# Patient Record
Sex: Male | Born: 1965
Health system: Southern US, Community
[De-identification: ages and names within clinical notes are randomized; demographics above are authoritative.]

## PROBLEM LIST (undated history)

## (undated) DIAGNOSIS — E669 Obesity, unspecified: Secondary | ICD-10-CM

## (undated) DIAGNOSIS — G4733 Obstructive sleep apnea (adult) (pediatric): Secondary | ICD-10-CM

## (undated) DIAGNOSIS — N200 Calculus of kidney: Secondary | ICD-10-CM

## (undated) DIAGNOSIS — M109 Gout, unspecified: Secondary | ICD-10-CM

## (undated) DIAGNOSIS — Z9989 Dependence on other enabling machines and devices: Secondary | ICD-10-CM

## (undated) HISTORY — DX: Obstructive sleep apnea (adult) (pediatric): G47.33

## (undated) HISTORY — DX: Gout, unspecified: M10.9

## (undated) HISTORY — DX: Morbid (severe) obesity due to excess calories: E66.01

## (undated) HISTORY — PX: SHOULDER SURGERY: SHX246

## (undated) HISTORY — DX: Dependence on other enabling machines and devices: Z99.89

## (undated) HISTORY — PX: LITHOTRIPSY: SUR834

---

## 2004-12-20 ENCOUNTER — Emergency Department (HOSPITAL_COMMUNITY): Admission: EM | Admit: 2004-12-20 | Discharge: 2004-12-20 | Payer: Self-pay | Admitting: Emergency Medicine

## 2013-04-17 ENCOUNTER — Emergency Department (HOSPITAL_BASED_OUTPATIENT_CLINIC_OR_DEPARTMENT_OTHER): Payer: No Typology Code available for payment source

## 2013-04-17 ENCOUNTER — Emergency Department (HOSPITAL_BASED_OUTPATIENT_CLINIC_OR_DEPARTMENT_OTHER)
Admission: EM | Admit: 2013-04-17 | Discharge: 2013-04-17 | Disposition: A | Discharge status: Home / Self Care | Payer: No Typology Code available for payment source | Attending: Emergency Medicine | Admitting: Emergency Medicine

## 2013-04-17 ENCOUNTER — Encounter (HOSPITAL_BASED_OUTPATIENT_CLINIC_OR_DEPARTMENT_OTHER): Payer: Self-pay | Admitting: Emergency Medicine

## 2013-04-17 DIAGNOSIS — Z79899 Other long term (current) drug therapy: Secondary | ICD-10-CM | POA: Insufficient documentation

## 2013-04-17 DIAGNOSIS — N200 Calculus of kidney: Secondary | ICD-10-CM | POA: Insufficient documentation

## 2013-04-17 DIAGNOSIS — R112 Nausea with vomiting, unspecified: Secondary | ICD-10-CM | POA: Insufficient documentation

## 2013-04-17 LAB — CBC WITH DIFFERENTIAL/PLATELET
Basophils Relative: 0 % (ref 0–1)
Eosinophils Absolute: 0.2 10*3/uL (ref 0.0–0.7)
Eosinophils Relative: 3 % (ref 0–5)
HCT: 39.6 % (ref 39.0–52.0)
Lymphocytes Relative: 25 % (ref 12–46)
MCH: 30.9 pg (ref 26.0–34.0)
MCHC: 34.6 g/dL (ref 30.0–36.0)
Monocytes Relative: 8 % (ref 3–12)
Platelets: 212 10*3/uL (ref 150–400)
RBC: 4.43 MIL/uL (ref 4.22–5.81)
RDW: 13.5 % (ref 11.5–15.5)
WBC: 7.7 10*3/uL (ref 4.0–10.5)

## 2013-04-17 LAB — URINALYSIS, ROUTINE W REFLEX MICROSCOPIC
Bilirubin Urine: NEGATIVE
Nitrite: NEGATIVE
Specific Gravity, Urine: 1.015 (ref 1.005–1.030)
Urobilinogen, UA: 1 mg/dL (ref 0.0–1.0)
pH: 6.5 (ref 5.0–8.0)

## 2013-04-17 LAB — BASIC METABOLIC PANEL
BUN: 13 mg/dL (ref 6–23)
Calcium: 9.1 mg/dL (ref 8.4–10.5)
Chloride: 102 mEq/L (ref 96–112)
GFR calc non Af Amer: 88 mL/min — ABNORMAL LOW (ref >90)
Potassium: 4.4 mEq/L (ref 3.5–5.1)

## 2013-04-17 MED ORDER — MORPHINE SULFATE 4 MG/ML IJ SOLN
INTRAMUSCULAR | Status: AC
  Filled 2013-04-17: qty 1

## 2013-04-17 MED ORDER — HYDROCODONE-ACETAMINOPHEN 5-325 MG PO TABS: 1.0000 | ORAL_TABLET | Freq: Four times a day (QID) | ORAL | Status: DC | PRN

## 2013-04-17 MED ORDER — MORPHINE SULFATE 4 MG/ML IJ SOLN: 6.0000 mg | Freq: Once | INTRAMUSCULAR | Status: DC

## 2013-04-17 MED ORDER — ONDANSETRON HCL 4 MG/2ML IJ SOLN
INTRAMUSCULAR | Status: AC
  Administered 2013-04-17: 4 mg via INTRAVENOUS
  Filled 2013-04-17: qty 2

## 2013-04-17 MED ORDER — ONDANSETRON HCL 4 MG/2ML IJ SOLN
4.0000 mg | Freq: Once | INTRAMUSCULAR | Status: AC
  Administered 2013-04-17: 4 mg via INTRAVENOUS

## 2013-04-17 MED ORDER — MORPHINE SULFATE 2 MG/ML IJ SOLN
INTRAMUSCULAR | Status: AC
  Administered 2013-04-17: 2 mg via INTRAVENOUS
  Filled 2013-04-17: qty 1

## 2013-04-17 MED ORDER — MORPHINE SULFATE 4 MG/ML IJ SOLN
4.0000 mg | Freq: Once | INTRAMUSCULAR | Status: AC
  Administered 2013-04-17: 4 mg via INTRAVENOUS

## 2013-04-17 MED ORDER — MORPHINE SULFATE 2 MG/ML IJ SOLN
2.0000 mg | Freq: Once | INTRAMUSCULAR | Status: AC
  Administered 2013-04-17: 2 mg via INTRAVENOUS

## 2013-04-17 NOTE — ED Notes (Signed)
Pt presents with acute onset left sided flank pain, pt with history of kidney stone

## 2013-04-17 NOTE — ED Provider Notes (Signed)
CSN: [id number]     Arrival date & time 04/17/13  0540 History   First MD Initiated Contact with Patient 04/17/13 [phone]     Chief Complaint  Patient presents with  . Flank Pain   (Consider location/radiation/quality/duration/timing/severity/associated sxs/prior Treatment) Patient is a 47 y.o. male presenting with flank pain and back pain. The history is provided by the patient. No language interpreter was used.  Flank Pain Pertinent negatives include no chest pain, no abdominal pain, no headaches and no shortness of breath.  Back Pain Pain location: L back, inferior scapula radiating to L flank. Quality:  Stabbing and shooting Radiates to: L flank. Pain severity:  Severe Onset quality:  Sudden Duration:  5 hours Timing:  Constant Progression:  Unchanged Chronicity:  New Relieved by: moveing. Worsened by:  Nothing tried Ineffective treatments: zantac. Associated symptoms: no abdominal pain, no chest pain, no dysuria, no fever, no headaches, no numbness and no weakness   Associated symptoms comment:  Nausea, vomiting    History reviewed. No pertinent past medical history. History reviewed. No pertinent past surgical history. History reviewed. No pertinent family history. History  Substance Use Topics  . Smoking status: Never Smoker   . Smokeless tobacco: Not on file  . Alcohol Use: No    Review of Systems  Constitutional: Negative for fever, activity change, appetite change and fatigue.  HENT: Negative for congestion, facial swelling, rhinorrhea and trouble swallowing.   Eyes: Negative for photophobia and pain.  Respiratory: Negative for cough, chest tightness and shortness of breath.   Cardiovascular: Negative for chest pain and leg swelling.  Gastrointestinal: Negative for nausea, vomiting, abdominal pain, diarrhea and constipation.  Endocrine: Negative for polydipsia and polyuria.  Genitourinary: Positive for flank pain. Negative for dysuria, urgency, decreased urine  volume and difficulty urinating.  Musculoskeletal: Positive for back pain. Negative for gait problem.  Skin: Negative for color change, rash and wound.  Allergic/Immunologic: Negative for immunocompromised state.  Neurological: Negative for dizziness, facial asymmetry, speech difficulty, weakness, numbness and headaches.  Psychiatric/Behavioral: Negative for confusion, decreased concentration and agitation.    Allergies  Review of patient's allergies indicates no known allergies.  Home Medications   Current Outpatient Rx  Name  Route  Sig  Dispense  Refill  . allopurinol (ZYLOPRIM) 100 MG tablet   Oral   Take 100 mg by mouth daily.         Marland Kitchen HYDROcodone-acetaminophen (NORCO) 5-325 MG per tablet   Oral   Take 1 tablet by mouth every 6 (six) hours as needed.   20 tablet   0    BP 154/85  Pulse 83  Temp(Src) 97.8 F (36.6 C) (Oral)  Resp 20  Ht 6' (1.829 m)  Wt 440 lb (199.583 kg)  BMI 59.66 kg/m2  SpO2 100% Physical Exam  Constitutional: He is oriented to person, place, and time. He appears well-developed and well-nourished. No distress.  HENT:  Head: Normocephalic and atraumatic.  Mouth/Throat: No oropharyngeal exudate.  Eyes: Pupils are equal, round, and reactive to light.  Neck: Normal range of motion. Neck supple.  Cardiovascular: Normal rate, regular rhythm and normal heart sounds.  Exam reveals no gallop and no friction rub.   No murmur heard. Pulmonary/Chest: Effort normal and breath sounds normal. No respiratory distress. He has no wheezes. He has no rales.    Abdominal: Soft. Bowel sounds are normal. He exhibits no distension and no mass. There is no tenderness. There is no rebound and no guarding.  Musculoskeletal: Normal range  of motion. He exhibits no edema and no tenderness.  Neurological: He is alert and oriented to person, place, and time.  Skin: Skin is warm and dry.  Psychiatric: He has a normal mood and affect.    ED Course  Procedures  (including critical care time) Labs Review Labs Reviewed  BASIC METABOLIC PANEL - Abnormal; Notable for the following:    Glucose, Bld 127 (*)    GFR calc non Af Amer 88 (*)    All other components within normal limits  URINALYSIS, ROUTINE W REFLEX MICROSCOPIC  CBC WITH DIFFERENTIAL  TROPONIN I   Imaging Review Ct Abdomen Pelvis Wo Contrast  04/17/2013   CLINICAL DATA:  Left flank pain.  History of nephrolithiasis.  EXAM: CT ABDOMEN AND PELVIS WITHOUT CONTRAST  TECHNIQUE: Multidetector CT imaging of the abdomen and pelvis was performed following the standard protocol without intravenous contrast.  COMPARISON:  11/28/2011  FINDINGS: Linear scarring or subsegmental atelectasis in the posterior left lower lobe. Unremarkable unfused evaluation of visualized portions of liver, nondilated gallbladder, spleen, adrenal glands, pancreas, abdominal aorta, stomach, small bowel, appendix, colon. No significant diverticular disease evident. Unenhanced CT was performed per clinician order. Lack of IV contrast limits sensitivity and specificity, especially for evaluation of abdominal/pelvic solid viscera.  Bilateral nephrolithiasis, largest stone or cluster in the left lower pole 11 mm, on the right in the lower pole 3 mm. No ureteral calculus, hydronephrosis, or ureterectasis. Urinary bladder is incompletely distended.  No ascites. No free air. No adenopathy evident. Degenerative disc and facet disease L5-S1.  IMPRESSION: 1. Bilateral nephrolithiasis without hydronephrosis or ureteral calculus.   Electronically Signed   By: Oley Balm M.D.   On: 04/17/2013 08:00   Dg Chest 2 View  04/17/2013   CLINICAL DATA:  Left-sided back pain, history of hypertension and obesity  EXAM: CHEST  2 VIEW  COMPARISON:  CT abdomen pelvis-11/28/2011  FINDINGS: Examination is degraded due to patient body habitus.  Borderline enlarged cardiac silhouette and mediastinal contours. Minimal bilateral infrahilar and bibasilar  opacities, left greater than right, favored to represent atelectasis. No discrete focal airspace opacities. There is mild eventration of the left hemidiaphragm. No pleural effusion or pneumothorax. No evidence of edema. No pneumothorax. Post ORIF of the presumed left glenoid, incompletely evaluated.  IMPRESSION: Perihilar and bibasilar atelectasis without definite acute cardiopulmonary disease.   Electronically Signed   By: Simonne Come M.D.   On: 04/17/2013 08:09    EKG Interpretation    Date/Time:  Saturday April 17 2013 06:38:18 EST Ventricular Rate:  69 PR Interval:  200 QRS Duration: 96 QT Interval:  396 QTC Calculation: 424 R Axis:   62 Text Interpretation:  Normal sinus rhythm Normal ECG Confirmed by DOCHERTY  MD, MEGAN ([id number]) on 04/17/2013 6:57:22 AM            MDM   1. Nephrolithiasis    Pt is a 47 y.o. male with Pmhx as above who presents with sudden onset L sharp posterior back pain radiating to L flank about 4.5 hours ago with associated nausea, vom. No CP, SOB, fever, urinary symptoms. Pt states pain similar to prior episodes of kidney stones, but pain located higher up on back than prior. Suspect kidney stone, but given pt obese, male, have also ordered CXR, EKG, trop.    8:02 AM EKG with no signs of ischemia. CBC, BMP, trop unremarkable.  CT with BL nephrolithiasis, but no hydronephrosis or ureteral calculus. Pt's pain resolved.  Given reassuring w/u suspect  passed stone.  ACS, PE, dissection unlikely. Will ask to f/u with PCP Monday.  Return precautions given for new or worsening symptoms including chest pain, SOB, leg pain/swelling    Shanna Cisco, MD 04/17/13 [phone]

## 2014-03-19 ENCOUNTER — Encounter (HOSPITAL_BASED_OUTPATIENT_CLINIC_OR_DEPARTMENT_OTHER): Payer: Self-pay | Admitting: Emergency Medicine

## 2014-03-19 ENCOUNTER — Emergency Department (HOSPITAL_BASED_OUTPATIENT_CLINIC_OR_DEPARTMENT_OTHER)
Admission: EM | Admit: 2014-03-19 | Discharge: 2014-03-19 | Disposition: A | Discharge status: Home / Self Care | Payer: No Typology Code available for payment source | Attending: Emergency Medicine | Admitting: Emergency Medicine

## 2014-03-19 ENCOUNTER — Emergency Department (HOSPITAL_BASED_OUTPATIENT_CLINIC_OR_DEPARTMENT_OTHER): Payer: No Typology Code available for payment source

## 2014-03-19 DIAGNOSIS — R10A0 Flank pain, unspecified side: Secondary | ICD-10-CM

## 2014-03-19 DIAGNOSIS — N200 Calculus of kidney: Secondary | ICD-10-CM

## 2014-03-19 DIAGNOSIS — Z79899 Other long term (current) drug therapy: Secondary | ICD-10-CM | POA: Insufficient documentation

## 2014-03-19 DIAGNOSIS — Z791 Long term (current) use of non-steroidal anti-inflammatories (NSAID): Secondary | ICD-10-CM | POA: Insufficient documentation

## 2014-03-19 DIAGNOSIS — E669 Obesity, unspecified: Secondary | ICD-10-CM | POA: Diagnosis not present

## 2014-03-19 DIAGNOSIS — R109 Unspecified abdominal pain: Secondary | ICD-10-CM | POA: Diagnosis present

## 2014-03-19 HISTORY — DX: Calculus of kidney: N20.0

## 2014-03-19 HISTORY — DX: Obesity, unspecified: E66.9

## 2014-03-19 LAB — I-STAT CHEM 8, ED
BUN: 18 mg/dL (ref 6–23)
CHLORIDE: 106 meq/L (ref 96–112)
CREATININE: 1.3 mg/dL (ref 0.50–1.35)
Calcium, Ion: 1.14 mmol/L (ref 1.12–1.23)
Glucose, Bld: 130 mg/dL — ABNORMAL HIGH (ref 70–99)
HEMATOCRIT: 44 % (ref 39.0–52.0)
HEMOGLOBIN: 15 g/dL (ref 13.0–17.0)
POTASSIUM: 3.4 meq/L — AB (ref 3.7–5.3)
SODIUM: 135 meq/L — AB (ref 137–147)
TCO2: 24 mmol/L (ref 0–100)

## 2014-03-19 LAB — CBC WITH DIFFERENTIAL/PLATELET
BASOS ABS: 0 10*3/uL (ref 0.0–0.1)
Basophils Relative: 0 % (ref 0–1)
EOS PCT: 2 % (ref 0–5)
Eosinophils Absolute: 0.2 10*3/uL (ref 0.0–0.7)
HCT: 42.7 % (ref 39.0–52.0)
Hemoglobin: 14.8 g/dL (ref 13.0–17.0)
LYMPHS ABS: 3.3 10*3/uL (ref 0.7–4.0)
Lymphocytes Relative: 32 % (ref 12–46)
MCH: 31.2 pg (ref 26.0–34.0)
MCHC: 34.7 g/dL (ref 30.0–36.0)
MCV: 89.9 fL (ref 78.0–100.0)
MONO ABS: 1 10*3/uL (ref 0.1–1.0)
MONOS PCT: 10 % (ref 3–12)
NEUTROS PCT: 56 % (ref 43–77)
Neutro Abs: 5.9 10*3/uL (ref 1.7–7.7)
Platelets: 236 10*3/uL (ref 150–400)
RBC: 4.75 MIL/uL (ref 4.22–5.81)
RDW: 13.8 % (ref 11.5–15.5)
WBC: 10.4 10*3/uL (ref 4.0–10.5)

## 2014-03-19 LAB — URINALYSIS, ROUTINE W REFLEX MICROSCOPIC
BILIRUBIN URINE: NEGATIVE
Glucose, UA: NEGATIVE mg/dL
KETONES UR: NEGATIVE mg/dL
Leukocytes, UA: NEGATIVE
Nitrite: NEGATIVE
Protein, ur: NEGATIVE mg/dL
Specific Gravity, Urine: 1.023 (ref 1.005–1.030)
UROBILINOGEN UA: 0.2 mg/dL (ref 0.0–1.0)
pH: 5.5 (ref 5.0–8.0)

## 2014-03-19 LAB — URINE MICROSCOPIC-ADD ON

## 2014-03-19 MED ORDER — ONDANSETRON 8 MG PO TBDP: ORAL_TABLET | ORAL | Status: DC

## 2014-03-19 MED ORDER — KETOROLAC TROMETHAMINE 30 MG/ML IJ SOLN
30.0000 mg | Freq: Once | INTRAMUSCULAR | Status: AC
  Administered 2014-03-19: 30 mg via INTRAVENOUS
  Filled 2014-03-19: qty 1

## 2014-03-19 MED ORDER — IBUPROFEN 800 MG PO TABS
800.0000 mg | ORAL_TABLET | Freq: Three times a day (TID) | ORAL | Status: DC
Start: 2014-03-19 — End: 2015-11-27

## 2014-03-19 MED ORDER — FENTANYL CITRATE 0.05 MG/ML IJ SOLN
50.0000 ug | Freq: Once | INTRAMUSCULAR | Status: AC
  Administered 2014-03-19: 50 ug via INTRAVENOUS
  Filled 2014-03-19: qty 2

## 2014-03-19 MED ORDER — TAMSULOSIN HCL 0.4 MG PO CAPS
0.4000 mg | ORAL_CAPSULE | Freq: Once | ORAL | Status: AC
  Administered 2014-03-19: 0.4 mg via ORAL
  Filled 2014-03-19: qty 1

## 2014-03-19 MED ORDER — HYDROMORPHONE HCL 1 MG/ML IJ SOLN
1.0000 mg | Freq: Once | INTRAMUSCULAR | Status: AC
  Administered 2014-03-19: 1 mg via INTRAVENOUS
  Filled 2014-03-19: qty 1

## 2014-03-19 MED ORDER — ONDANSETRON HCL 4 MG/2ML IJ SOLN
4.0000 mg | Freq: Once | INTRAMUSCULAR | Status: AC
  Administered 2014-03-19: 4 mg via INTRAVENOUS
  Filled 2014-03-19: qty 2

## 2014-03-19 MED ORDER — OXYCODONE-ACETAMINOPHEN 5-325 MG PO TABS: 1.0000 | ORAL_TABLET | Freq: Four times a day (QID) | ORAL | Status: DC | PRN

## 2014-03-19 MED ORDER — TAMSULOSIN HCL 0.4 MG PO CAPS: 0.4000 mg | ORAL_CAPSULE | Freq: Every day | ORAL | Status: DC

## 2014-03-19 NOTE — ED Notes (Signed)
Pt reports hx of kidney stones - c/o acute onset of left flank pain approx 2hrs pta - pt also w/ n/v as well x1. Pt denies fever or hematuria. Pt restless, tachypniec and diaphoretic on arrival to room.

## 2014-03-19 NOTE — ED Notes (Signed)
Pt ambulating independently w/ steady gait on d/c in no acute distress, A&Ox4. D/c instructions reviewed w/ pt and family - pt and family deny any further questions or concerns at present. Rx given x4  

## 2014-03-19 NOTE — ED Provider Notes (Signed)
CSN: [id number]     Arrival date & time 03/19/14  0143 History   First MD Initiated Contact with Patient 03/19/14 0220     Chief Complaint  Patient presents with  . Flank Pain  . Nephrolithiasis     (Consider location/radiation/quality/duration/timing/severity/associated sxs/prior Treatment) Patient is a 48 y.o. male presenting with flank pain. The history is provided by the patient.  Flank Pain This is a recurrent problem. The current episode started 3 to 5 hours ago. The problem occurs constantly. The problem has not changed since onset.Pertinent negatives include no chest pain, no abdominal pain, no headaches and no shortness of breath. Nothing aggravates the symptoms. Nothing relieves the symptoms. He has tried nothing for the symptoms. The treatment provided no relief.  H/o of stones  Past Medical History  Diagnosis Date  . Kidney stones   . Obesity    Past Surgical History  Procedure Laterality Date  . Shoulder surgery    . Lithotripsy     History reviewed. No pertinent family history. History  Substance Use Topics  . Smoking status: Never Smoker   . Smokeless tobacco: Not on file  . Alcohol Use: Yes     Comment: socially    Review of Systems  Respiratory: Negative for shortness of breath.   Cardiovascular: Negative for chest pain.  Gastrointestinal: Positive for vomiting. Negative for abdominal pain.  Genitourinary: Positive for flank pain.  Neurological: Negative for headaches.  All other systems reviewed and are negative.     Allergies  Review of patient's allergies indicates no known allergies.  Home Medications   Prior to Admission medications   Medication Sig Start Date End Date Taking? Authorizing Provider  meloxicam (MOBIC) 15 MG tablet Take 15 mg by mouth daily.   Yes Historical Provider, MD  allopurinol (ZYLOPRIM) 100 MG tablet Take 100 mg by mouth daily.    Historical Provider, MD  HYDROcodone-acetaminophen (NORCO) 5-325 MG per tablet Take 1  tablet by mouth every 6 (six) hours as needed. 04/17/13   Toy CookeyMegan Docherty, MD   BP 170/77  Pulse 93  Temp(Src) 97.6 F (36.4 C) (Oral)  Resp 16  Ht 6' (1.829 m)  Wt 438 lb (198.675 kg)  BMI 59.39 kg/m2  SpO2 96% Physical Exam  Constitutional: He is oriented to person, place, and time. He appears well-developed and well-nourished. No distress.  HENT:  Head: Normocephalic and atraumatic.  Mouth/Throat: Oropharynx is clear and moist.  Eyes: Conjunctivae are normal. Pupils are equal, round, and reactive to light.  Neck: Normal range of motion. Neck supple. No tracheal deviation present.  Cardiovascular: Normal rate, regular rhythm and intact distal pulses.   Pulmonary/Chest: Effort normal and breath sounds normal. He has no wheezes. He has no rales.  Abdominal: Soft. Bowel sounds are normal. There is no tenderness. There is no rebound and no guarding.  Musculoskeletal: Normal range of motion.  Lymphadenopathy:    He has no cervical adenopathy.  Neurological: He is alert and oriented to person, place, and time.  Skin: Skin is warm and dry.  Psychiatric: He has a normal mood and affect.    ED Course  Procedures (including critical care time) Labs Review Labs Reviewed  I-STAT CHEM 8, ED - Abnormal; Notable for the following:    Sodium 135 (*)    Potassium 3.4 (*)    Glucose, Bld 130 (*)    All other components within normal limits  CBC WITH DIFFERENTIAL  URINALYSIS, ROUTINE W REFLEX MICROSCOPIC    Imaging  Review Ct Abdomen Pelvis Wo Contrast  03/19/2014   CLINICAL DATA:  Acute onset of left flank pain.  Diaphoresis.  EXAM: CT ABDOMEN AND PELVIS WITHOUT CONTRAST  TECHNIQUE: Multidetector CT imaging of the abdomen and pelvis was performed following the standard protocol without IV contrast.  COMPARISON:  CT scan dated 04/17/2013  FINDINGS: There are two 7 mm stones obstructing the proximal left ureter. There are multiple stones in the lower pole of the left kidney as well as a 5 mm  stone in the left renal pelvis. There is a 5 mm stone in the lower pole of the right kidney.  There are multiple gallstones. The gallbladder is not distended. No dilated bile ducts. Liver parenchyma, spleen, pancreas, and adrenal glands are normal. There are a few scattered diverticula in the colon. Terminal ileum and appendix are normal. Bladder is normal. Prostate gland is not enlarged. Severe degenerative disc disease at L5-S1.  IMPRESSION: 1. Two 7 mm stones obstruct the proximal left ureter. 2. Bilateral renal calculi. 3. Cholelithiasis.   Electronically Signed   By: Geanie CooleyJim  Maxwell M.D.   On: 03/19/2014 02:53     EKG Interpretation None      MDM   Final diagnoses:  Flank pain    Medications  fentaNYL (SUBLIMAZE) injection 50 mcg (50 mcg Intravenous Given 03/19/14 0210)  ondansetron (ZOFRAN) injection 4 mg (4 mg Intravenous Given 03/19/14 0210)  ketorolac (TORADOL) 30 MG/ML injection 30 mg (30 mg Intravenous Given 03/19/14 0244)  ondansetron (ZOFRAN) injection 4 mg (4 mg Intravenous Given 03/19/14 0244)  HYDROmorphone (DILAUDID) injection 1 mg (1 mg Intravenous Given 03/19/14 0303)     Strain all urine follow up with urology in 7 days.  Return for intractable pain vomiting or fevers.    Jasmine AweApril K Nathaneal Sommers-Rasch, MD 03/19/14 [phone]

## 2015-10-31 ENCOUNTER — Ambulatory Visit: Payer: No Typology Code available for payment source | Admitting: Internal Medicine

## 2015-11-27 ENCOUNTER — Emergency Department (HOSPITAL_BASED_OUTPATIENT_CLINIC_OR_DEPARTMENT_OTHER)
Admission: EM | Admit: 2015-11-27 | Discharge: 2015-11-27 | Disposition: A | Discharge status: Home / Self Care | Payer: No Typology Code available for payment source | Attending: Emergency Medicine | Admitting: Emergency Medicine

## 2015-11-27 ENCOUNTER — Encounter (HOSPITAL_BASED_OUTPATIENT_CLINIC_OR_DEPARTMENT_OTHER): Payer: Self-pay | Admitting: Emergency Medicine

## 2015-11-27 ENCOUNTER — Emergency Department (HOSPITAL_BASED_OUTPATIENT_CLINIC_OR_DEPARTMENT_OTHER): Payer: No Typology Code available for payment source

## 2015-11-27 DIAGNOSIS — R109 Unspecified abdominal pain: Secondary | ICD-10-CM | POA: Insufficient documentation

## 2015-11-27 DIAGNOSIS — Z6841 Body Mass Index (BMI) 40.0 and over, adult: Secondary | ICD-10-CM | POA: Insufficient documentation

## 2015-11-27 DIAGNOSIS — E669 Obesity, unspecified: Secondary | ICD-10-CM | POA: Insufficient documentation

## 2015-11-27 LAB — CBG MONITORING, ED: GLUCOSE-CAPILLARY: 144 mg/dL — AB (ref 65–99)

## 2015-11-27 LAB — COMPREHENSIVE METABOLIC PANEL
ALBUMIN: 4.2 g/dL (ref 3.5–5.0)
ALK PHOS: 80 U/L (ref 38–126)
ALT: 37 U/L (ref 17–63)
AST: 29 U/L (ref 15–41)
Anion gap: 12 (ref 5–15)
BILIRUBIN TOTAL: 0.6 mg/dL (ref 0.3–1.2)
BUN: 17 mg/dL (ref 6–20)
CO2: 22 mmol/L (ref 22–32)
CREATININE: 1.2 mg/dL (ref 0.61–1.24)
Calcium: 9.6 mg/dL (ref 8.9–10.3)
Chloride: 104 mmol/L (ref 101–111)
GFR calc non Af Amer: >60 mL/min (ref >60)
Glucose, Bld: 147 mg/dL — ABNORMAL HIGH (ref 65–99)
Potassium: 3.9 mmol/L (ref 3.5–5.1)
Sodium: 138 mmol/L (ref 135–145)
Total Protein: 7.3 g/dL (ref 6.5–8.1)

## 2015-11-27 LAB — CBC WITH DIFFERENTIAL/PLATELET
BASOS PCT: 0 %
Basophils Absolute: 0 10*3/uL (ref 0.0–0.1)
Eosinophils Absolute: 0.2 10*3/uL (ref 0.0–0.7)
Eosinophils Relative: 2 %
HEMATOCRIT: 45.9 % (ref 39.0–52.0)
HEMOGLOBIN: 16.2 g/dL (ref 13.0–17.0)
Lymphocytes Relative: 35 %
Lymphs Abs: 3.7 10*3/uL (ref 0.7–4.0)
MCH: 31 pg (ref 26.0–34.0)
MCHC: 35.3 g/dL (ref 30.0–36.0)
MCV: 87.9 fL (ref 78.0–100.0)
MONOS PCT: 9 %
Monocytes Absolute: 1 10*3/uL (ref 0.1–1.0)
NEUTROS ABS: 5.6 10*3/uL (ref 1.7–7.7)
NEUTROS PCT: 53 %
Platelets: 232 10*3/uL (ref 150–400)
RBC: 5.22 MIL/uL (ref 4.22–5.81)
RDW: 14.9 % (ref 11.5–15.5)
WBC: 10.5 10*3/uL (ref 4.0–10.5)

## 2015-11-27 LAB — URINALYSIS, ROUTINE W REFLEX MICROSCOPIC
Bilirubin Urine: NEGATIVE
Glucose, UA: NEGATIVE mg/dL
Hgb urine dipstick: NEGATIVE
KETONES UR: NEGATIVE mg/dL
LEUKOCYTES UA: NEGATIVE
Nitrite: NEGATIVE
PH: 5.5 (ref 5.0–8.0)
Protein, ur: NEGATIVE mg/dL
Specific Gravity, Urine: 1.022 (ref 1.005–1.030)

## 2015-11-27 LAB — LIPASE, BLOOD: Lipase: 24 U/L (ref 11–51)

## 2015-11-27 MED ORDER — MORPHINE SULFATE (PF) 4 MG/ML IV SOLN
8.0000 mg | Freq: Once | INTRAVENOUS | Status: AC
  Administered 2015-11-27: 8 mg via INTRAVENOUS
  Filled 2015-11-27: qty 2

## 2015-11-27 MED ORDER — SODIUM CHLORIDE 0.9 % IV BOLUS (SEPSIS): 1000.0000 mL | Freq: Once | INTRAVENOUS | Status: DC

## 2015-11-27 MED ORDER — ONDANSETRON HCL 4 MG/2ML IJ SOLN
4.0000 mg | Freq: Once | INTRAMUSCULAR | Status: AC
  Administered 2015-11-27: 4 mg via INTRAVENOUS

## 2015-11-27 MED ORDER — MORPHINE SULFATE (PF) 4 MG/ML IV SOLN
8.0000 mg | Freq: Once | INTRAVENOUS | Status: AC
Start: 2015-11-27 — End: 2015-11-27
  Administered 2015-11-27: 8 mg via INTRAVENOUS

## 2015-11-27 MED ORDER — KETOROLAC TROMETHAMINE 30 MG/ML IJ SOLN
30.0000 mg | Freq: Once | INTRAMUSCULAR | Status: AC
  Administered 2015-11-27: 30 mg via INTRAVENOUS

## 2015-11-27 MED ORDER — ONDANSETRON 4 MG PO TBDP: ORAL_TABLET | ORAL | Status: DC

## 2015-11-27 MED ORDER — MORPHINE SULFATE 15 MG PO TABS: 15.0000 mg | ORAL_TABLET | ORAL | Status: DC | PRN

## 2015-11-27 MED ORDER — ONDANSETRON HCL 4 MG/2ML IJ SOLN
4.0000 mg | Freq: Once | INTRAMUSCULAR | Status: AC
  Administered 2015-11-27: 4 mg via INTRAVENOUS
  Filled 2015-11-27: qty 2

## 2015-11-27 MED ORDER — ONDANSETRON 4 MG PO TBDP
ORAL_TABLET | ORAL | Status: AC
  Administered 2015-11-27: 11:00:00
  Filled 2015-11-27: qty 1

## 2015-11-27 MED FILL — MORPHINE SULFATE IR 15 MG T: 15 | 1 days supply | Qty: 7 | Fill #0

## 2015-11-27 MED FILL — ONDANSETRON ODT 4 MG TABLET: 4 | 30 days supply | Qty: 15 | Fill #0

## 2015-11-27 NOTE — ED Notes (Signed)
Left flank pain and nausea since 2am.  Hx of kidney stones, reports that it feels the same.

## 2015-11-27 NOTE — ED Notes (Signed)
Downtime charting:0755 PT arrived diaphoretic with complaints of left flank pain since 2am today.  History of kidney stones. pts wife at bedside with him.  Pt A/O x 4.  Pt in obvious pain.

## 2015-11-27 NOTE — Discharge Instructions (Signed)
Flank Pain °Flank pain refers to pain that is located on the side of the body between the upper abdomen and the back. The pain may occur over a short period of time (acute) or may be long-term or reoccurring (chronic). It may be mild or severe. Flank pain can be caused by many things. °CAUSES  °Some of the more common causes of flank pain include: °· Muscle strains.   °· Muscle spasms.   °· A disease of your spine (vertebral disk disease).   °· A lung infection (pneumonia).   °· Fluid around your lungs (pulmonary edema).   °· A kidney infection.   °· Kidney stones.   °· A very painful skin rash caused by the chickenpox virus (shingles).   °· Gallbladder disease.   °HOME CARE INSTRUCTIONS  °Home care will depend on the cause of your pain. In general, °· Rest as directed by your caregiver. °· Drink enough fluids to keep your urine clear or pale yellow. °· Only take over-the-counter or prescription medicines as directed by your caregiver. Some medicines may help relieve the pain. °· Tell your caregiver about any changes in your pain. °· Follow up with your caregiver as directed. °SEEK IMMEDIATE MEDICAL CARE IF:  °· Your pain is not controlled with medicine.   °· You have new or worsening symptoms. °· Your pain increases.   °· You have abdominal pain.   °· You have shortness of breath.   °· You have persistent nausea or vomiting.   °· You have swelling in your abdomen.   °· You feel faint or pass out.   °· You have blood in your urine. °· You have a fever or persistent symptoms for more than 2-3 days. °· You have a fever and your symptoms suddenly get worse. °MAKE SURE YOU:  °· Understand these instructions. °· Will watch your condition. °· Will get help right away if you are not doing well or get worse. °  °This information is not intended to replace advice given to you by your health care provider. Make sure you discuss any questions you have with your health care provider. °  °Document Released: 06/27/2005 Document  Revised: 01/29/2012 Document Reviewed: 12/19/2011 °[employer] Interactive Patient Education ©2016 [employer] ° °

## 2015-11-27 NOTE — ED Provider Notes (Signed)
CSN: [id number]     Arrival date & time 11/27/15  0730 History   First MD Initiated Contact with Patient 11/27/15 [phone]     Chief Complaint  Patient presents with  . Nephrolithiasis     (Consider location/radiation/quality/duration/timing/severity/associated sxs/prior Treatment) Patient is a 50 y.o. male presenting with flank pain. The history is provided by the patient and the spouse.  Flank Pain This is a recurrent problem. The current episode started 6 to 12 hours ago. The problem occurs constantly. The problem has not changed since onset.Pertinent negatives include no chest pain, no abdominal pain, no headaches and no shortness of breath. Nothing aggravates the symptoms. Nothing relieves the symptoms. He has tried nothing for the symptoms. The treatment provided no relief.   50 yo M With left flank pain. The started this morning. Pain is sharp severe stabbing. Denies radiation. Pain comes and goes. Has had some diaphoresis with this. Vomiting. He feels that his prior kidney stones however he has some pain at higher than normal. Patient denies any chest pain or shortness of breath. Had a history of a lymphadenopathy thought was secondary to paracytosis. They think he contracted this in [place]. Patient has not had any recent long travel.  Past Medical History  Diagnosis Date  . Kidney stones   . Obesity   . Obesity    Past Surgical History  Procedure Laterality Date  . Shoulder surgery    . Lithotripsy     History reviewed. No pertinent family history. Social History  Substance Use Topics  . Smoking status: Never Smoker   . Smokeless tobacco: None  . Alcohol Use: Yes     Comment: socially    Review of Systems  Constitutional: Positive for diaphoresis. Negative for fever and chills.  HENT: Negative for congestion and facial swelling.   Eyes: Negative for discharge and visual disturbance.  Respiratory: Negative for shortness of breath.   Cardiovascular: Negative for chest pain  and palpitations.  Gastrointestinal: Positive for nausea. Negative for vomiting, abdominal pain and diarrhea.  Genitourinary: Positive for flank pain.  Musculoskeletal: Negative for myalgias and arthralgias.  Skin: Negative for color change and rash.  Neurological: Negative for tremors, syncope and headaches.  Psychiatric/Behavioral: Negative for confusion and dysphoric mood.      Allergies  Review of patient's allergies indicates no known allergies.  Home Medications   Prior to Admission medications   Medication Sig Start Date End Date Taking? Authorizing Provider  morphine (MSIR) 15 MG tablet Take 1 tablet (15 mg total) by mouth every 4 (four) hours as needed for severe pain. 11/27/15   Melene Plan, DO  ondansetron (ZOFRAN ODT) 4 MG disintegrating tablet  ODT q4 hours prn nausea/vomit 11/27/15   Melene Plan, DO   BP 198/87 mmHg  Pulse 74  Temp(Src) 98.3 F (36.8 C) (Oral)  Resp 22  Ht 6' (1.829 m)  Wt 420 lb (190.511 kg)  BMI 56.95 kg/m2  SpO2 99% Physical Exam  Constitutional: He is oriented to person, place, and time. He appears well-developed and well-nourished.  HENT:  Head: Normocephalic and atraumatic.  Eyes: EOM are normal. Pupils are equal, round, and reactive to light.  Neck: Normal range of motion. Neck supple. No JVD present.  Cardiovascular: Normal rate and regular rhythm.  Exam reveals no gallop and no friction rub.   No murmur heard. Pulmonary/Chest: No respiratory distress. He has no wheezes.  Abdominal: He exhibits no distension. There is no rebound and no guarding.  Musculoskeletal: Normal range  of motion. He exhibits tenderness (about the left CVA).  No midline spinal TTP.  No noted weakness to either lower extremity   Neurological: He is alert and oriented to person, place, and time.  Skin: No rash noted. No pallor.  Psychiatric: He has a normal mood and affect. His behavior is normal.  Nursing note and vitals reviewed.   ED Course  Procedures  (including critical care time) Labs Review Labs Reviewed  URINALYSIS, ROUTINE W REFLEX MICROSCOPIC (NOT AT [facility])  CBC WITH DIFFERENTIAL/PLATELET  COMPREHENSIVE METABOLIC PANEL  LIPASE, BLOOD    Imaging Review Dg Chest 2 View  11/27/2015  CLINICAL DATA:  Left lateral abdominal pain and left flank pain. EXAM: CHEST  2 VIEW COMPARISON:  04/17/2013 FINDINGS: The heart size and mediastinal contours are within normal limits. Both lungs are clear. Fixation screw in the left scapula. No acute bone abnormality. IMPRESSION: No active cardiopulmonary disease. Electronically Signed   By: Francene BoyersJames  Maxwell M.D.   On: 11/27/2015 09:23   Ct Renal Stone Study  11/27/2015  CLINICAL DATA:  Left flank pain and nausea since 2 a.m. today. History kidney stones. EXAM: CT ABDOMEN AND PELVIS WITHOUT CONTRAST TECHNIQUE: Multidetector CT imaging of the abdomen and pelvis was performed following the standard protocol without IV contrast. COMPARISON:  CT scan dated 03/19/2014 FINDINGS: Lower chest:  Normal. Hepatobiliary: The liver parenchyma is normal. Multiple gallstones more apparent than on the prior study. No bile duct dilatation. Pancreas: No mass or inflammatory process identified on this un-enhanced exam. Spleen: Within normal limits in size. Adrenals/Urinary Tract: Small stones in the lower pole of each kidney. No ureteral or bladder calculi. Stomach/Bowel: No evidence of obstruction, inflammatory process, or abnormal fluid collections. Vascular/Lymphatic: No pathologically enlarged lymph nodes. No evidence of abdominal aortic aneurysm. Reproductive: No mass or other significant abnormality. Other: None. Musculoskeletal: No acute abnormality. Severe degenerative disc disease and joint disease at L5-S1. IMPRESSION: 1. Solitary small stones in the lower pole of each kidney. No ureteral or bladder calculi. 2. Chronic cholelithiasis. Electronically Signed   By: Francene BoyersJames  Maxwell M.D.   On: 11/27/2015 09:22   I have personally  reviewed and evaluated these images and lab results as part of my medical decision-making.  ED ECG REPORT   Date: 11/27/2015  Rate: 71  Rhythm: normal sinus rhythm  QRS Axis: right  Intervals: PR prolonged  ST/T Wave abnormalities: normal  Conduction Disutrbances:none  Narrative Interpretation:   Old EKG Reviewed: changes noted new 1rst degree av block  I have personally reviewed the EKG tracing and agree with the computerized printout as noted.  MDM   Final diagnoses:  Acute left flank pain    50 yo M with left flank pain. This feels ago kidney stone and however is little bit higher than normal. Patient's pain is better after morphine and toradol.  This patient's pain is atypical of his normal obtain a CT stone study as well as a chest x-ray.  CT stone study is negative. Patient has a normal chest x-ray. The patient's pain is significantly improved. I'm unsure of the etiology of this pain. Feel that PE is unlikely with no recent travel no lower extremity swelling no tachycardia. The patient a wells 0.  The patient follow-up with his family physician. He continues to have worsening pain he likely needs a angiogram of the chest rule out PE.  9:54 AM:  I have discussed the diagnosis/risks/treatment options with the patient and family and believe the pt to be eligible  for discharge home to follow-up with PCP. We also discussed returning to the ED immediately if new or worsening sx occur. We discussed the sx which are most concerning (e.g., sudden worsening pain, fever, inability to tolerate by mouth, sob) that necessitate immediate return. Medications administered to the patient during their visit and any new prescriptions provided to the patient are listed below.  Medications given during this visit Medications  sodium chloride 0.9 % bolus 1,000 mL (not administered)  morphine 4 MG/ML injection 8 mg (8 mg Intravenous Given 11/27/15 0900)  ondansetron (ZOFRAN) injection 4 mg (4 mg  Intravenous Given 11/27/15 0900)  morphine 4 MG/ML injection 8 mg (8 mg Intravenous Given During Downtime 11/27/15 0844)  ondansetron (ZOFRAN) injection 4 mg (4 mg Intravenous Given During Downtime 11/27/15 0844)  ketorolac (TORADOL) 30 MG/ML injection 30 mg (30 mg Intravenous Given During Downtime 11/27/15 0845)    New Prescriptions   MORPHINE (MSIR) 15 MG TABLET    Take 1 tablet (15 mg total) by mouth every 4 (four) hours as needed for severe pain.   ONDANSETRON (ZOFRAN ODT) 4 MG DISINTEGRATING TABLET    4mg  ODT q4 hours prn nausea/vomit    The patient appears reasonably screen and/or stabilized for discharge and I doubt any other medical condition or other [facility] requiring further screening, evaluation, or treatment in the ED at this time prior to discharge.    Melene Plan, DO 11/27/15 [phone]

## 2016-12-30 ENCOUNTER — Encounter: Payer: Self-pay | Admitting: Cardiology

## 2017-01-16 ENCOUNTER — Ambulatory Visit (INDEPENDENT_AMBULATORY_CARE_PROVIDER_SITE_OTHER): Payer: No Typology Code available for payment source | Admitting: Cardiology

## 2017-01-16 ENCOUNTER — Encounter (INDEPENDENT_AMBULATORY_CARE_PROVIDER_SITE_OTHER): Payer: Self-pay

## 2017-01-16 ENCOUNTER — Encounter: Payer: Self-pay | Admitting: Cardiology

## 2017-01-16 VITALS — BP 150/90 | HR 69 | Ht 72.0 in | Wt >= 6400 oz

## 2017-01-16 DIAGNOSIS — G4733 Obstructive sleep apnea (adult) (pediatric): Secondary | ICD-10-CM | POA: Insufficient documentation

## 2017-01-16 DIAGNOSIS — I491 Atrial premature depolarization: Secondary | ICD-10-CM | POA: Diagnosis not present

## 2017-01-16 DIAGNOSIS — Z9989 Dependence on other enabling machines and devices: Secondary | ICD-10-CM | POA: Diagnosis not present

## 2017-01-16 DIAGNOSIS — R079 Chest pain, unspecified: Secondary | ICD-10-CM

## 2017-01-16 DIAGNOSIS — M109 Gout, unspecified: Secondary | ICD-10-CM | POA: Insufficient documentation

## 2017-01-16 HISTORY — DX: Morbid (severe) obesity due to excess calories: E66.01

## 2017-01-16 NOTE — Progress Notes (Addendum)
Cardiology Office Note:    Date:  01/16/2017   ID:  John Osborn, DOB November 04, 1965, MRN [id number]  PCP:  Ladora Daniel, PA-C  Cardiologist:  Armanda Magic, MD   Referring MD: No ref. provider found   Chief Complaint  Patient presents with  . Sleep Apnea    History of Present Illness:    John Osborn is a 51 y.o. male with a hx of OSA on CPAP and obesity who presents today to establish and new sleep medicine doctor. He had his sleep study done in 1998 in [place] but has not had a sleep study since then.  He is currently on CPAP at 15cm H2O he thinks.  He uses a nasal pillow mask which he tolerates and does not use a chin strap.  He does not snore when he uses the CPAP.  He usually goes to bed around 10pm and falls asleep immediately.  He gets up around 4am to urinate and then gets up around 7am.  He does not feel rested in the am.  He occasionally will have dogs in the bed so his sleep sometimes is disrupted.  He does not have much problems during the day once he is up and does not nap during the day.  He tries to exercise 3 times weekly by riding the bike.  He also has been having some vague chest discomfort recently that is both exertional and nonexertional.  Usually he does not notice it when he is exercising on the bike.  He has chronic DOE due to his morbid obesity which he thinks is unchanged.    Past Medical History:  Diagnosis Date  . Gout   . Kidney stones   . Morbid obesity (HCC) 01/16/2017  . Obesity   . OSA on CPAP     Past Surgical History:  Procedure Laterality Date  . LITHOTRIPSY    . SHOULDER SURGERY      Current Medications: Current Meds  Medication Sig  . allopurinol (ZYLOPRIM) 300 MG tablet Take 300 mg by mouth daily.  . Cholecalciferol 10000 units TABS Take 4,000 Units by mouth daily.  . Cyanocobalamin (B-12 PO) Take 1 tablet by mouth daily.  . Emtricitabine-Tenofovir DF (TRUVADA PO) Take by mouth daily.  . fluticasone (FLONASE) 50 MCG/ACT nasal spray Place 2  sprays into both nostrils as directed.   . indapamide (LOZOL) 2.5 MG tablet Take 2.5 mg by mouth daily.  . Misc Natural Products (STRESS RELEAF PO) Take by mouth daily.  . Multiple Vitamin (MULTIVITAMIN) tablet Take 1 tablet by mouth daily.  . Multiple Vitamins-Minerals (ZINC PO) Take 10 mg by mouth daily.  . Naltrexone-Bupropion HCl ER 8-90 MG TB12 Take 2 tablets by mouth 2 (two) times daily.  . sildenafil (VIAGRA) 100 MG tablet Take 50 mg by mouth daily as needed for erectile dysfunction.  Marland Kitchen testosterone cypionate (DEPOTESTOSTERONE CYPIONATE) 200 MG/ML injection Inject into the muscle every 21 ( twenty-one) days.     Allergies:   No known allergies   Social History   Social History  . Marital status: Married    Spouse name: N/A  . Number of children: N/A  . Years of education: N/A   Social History Main Topics  . Smoking status: Never Smoker  . Smokeless tobacco: Never Used  . Alcohol use Yes     Comment: socially  . Drug use: No  . Sexual activity: Not Asked   Other Topics Concern  . None   Social History Narrative  .  None     Family History: The patient's family history includes Aortic stenosis in his father; Breast cancer in his mother; Diabetes in his father.  ROS:   Please see the history of present illness.     All other systems reviewed and are negative.  EKGs/Labs/Other Studies Reviewed:    The following studies were reviewed today: Office notes from PCP  EKG:  EKG is  ordered today.  The ekg ordered today demonstrates NSR with PACs  Recent Labs: No results found for requested labs within last 8760 hours.   Recent Lipid Panel No results found for: CHOL, TRIG, HDL, CHOLHDL, VLDL, LDLCALC, LDLDIRECT  Physical Exam:    VS:  BP (!) 150/90   Pulse 69   Ht 6' (1.829 m)   Wt (!) 440 lb 9.6 oz (199.9 kg)   SpO2 97%   BMI 59.76 kg/m     Wt Readings from Last 3 Encounters:  01/16/17 (!) 440 lb 9.6 oz (199.9 kg)  11/27/15 (!) 420 lb (190.5 kg)    03/19/14 (!) 438 lb (198.7 kg)     GEN:  Well nourished, well developed in no acute distress HEENT: marked tonsillar enlargement NECK: No JVD; No carotid bruits LYMPHATICS: No lymphadenopathy CARDIAC: RRR, no murmurs, rubs, gallops RESPIRATORY:  Clear to auscultation without rales, wheezing or rhonchi  ABDOMEN: Soft, non-tender, non-distended MUSCULOSKELETAL:  No edema; No deformity  SKIN: Warm and dry NEUROLOGIC:  Alert and oriented x 3 PSYCHIATRIC:  Normal affect   ASSESSMENT:    1. OSA on CPAP   2. Chest pain, unspecified type   3. PAC (premature atrial contraction)   4. Morbid obesity (HCC)    PLAN:    In order of problems listed above:  OSA - the patient is tolerating PAP therapy well without any problems. The patient has been using and benefiting from CPAP use and will continue to benefit from therapy. His CPAP works very well for him but he is having problems with feeling fatigued in the am which I suspect is due to the reduced number of hours of sleep he gets.  I encouraged him to go to bed earlier at night.  He has not had a sleep study in 10 years so I will get a split night sleep study to see where his pressure should be as he may not be on the appropriate pressure and if having too many apneas, this could also be causing his sleepiness.  I will get him a new CPAP machine that has a modem in it as well. He does have enlarged tonsils on exam but given his age and morbid obesity I think he would be high risk for tonsillectomy and his CPAP is working well.  Chest pain - this is vague in description but concerning in that he is morbidly obese and has borderline HTN.  I will get a 2D echo to assess LVF and ETT to rule out ischemia.  His baseline EKG is normal.  PAC's - these are benign Morbid obesity - I have encouraged him to step up his exercise program to 5-6 days weekly if his ETT is normal and cut back on carbs and portions.     Medication Adjustments/Labs and Tests  Ordered: Current medicines are reviewed at length with the patient today.  Concerns regarding medicines are outlined above.  Orders Placed This Encounter  Procedures  . EXERCISE TOLERANCE TEST  . EKG 12-Lead  . ECHOCARDIOGRAM COMPLETE  . Split night study  No orders of the defined types were placed in this encounter.   Signed, Armanda Magicraci Marissah Vandemark, MD  01/16/2017 1:09 PM    [facility]

## 2017-01-16 NOTE — Patient Instructions (Signed)
Medication Instructions:  Your provider recommends that you continue on your current medications as directed. Please refer to the Current Medication list given to you today.    Labwork: None  Testing/Procedures: Your provider has requested that you have an [facility] tolerance [facility]. For further information please visit [email or web address]. Please also follow instruction sheet, as given.  Your provider has requested that you have an echocardiogram. Echocardiography is a painless test that uses sound waves to create images of your heart. It provides your doctor with information about the size and shape of your heart and how well your heart's chambers and valves are working. This procedure takes approximately one hour. There are no restrictions for this procedure.    Your physician has recommended that you have a sleep study. This test records several body functions during sleep, including: brain activity, eye movement, oxygen and carbon dioxide blood levels, heart rate and rhythm, breathing rate and rhythm, the flow of air through your mouth and nose, snoring, body muscle movements, and chest and belly movement.  Follow-Up: Your provider wants you to follow-up in: 1 year with Dr. Mayford Knifeurner. You will receive a reminder letter in the mail two months in advance. If you don't receive a letter, please call our office to schedule the follow-up appointment.    Any Other Special Instructions Will Be Listed Below (If Applicable).     If you need a refill on your cardiac medications before your next appointment, please call your pharmacy.

## 2017-01-17 ENCOUNTER — Ambulatory Visit (HOSPITAL_COMMUNITY): Payer: No Typology Code available for payment source | Attending: Cardiovascular Disease

## 2017-01-17 ENCOUNTER — Telehealth: Payer: Self-pay | Admitting: *Deleted

## 2017-01-17 ENCOUNTER — Other Ambulatory Visit: Payer: Self-pay

## 2017-01-17 ENCOUNTER — Encounter: Payer: Self-pay | Admitting: Cardiology

## 2017-01-17 DIAGNOSIS — G4733 Obstructive sleep apnea (adult) (pediatric): Secondary | ICD-10-CM | POA: Diagnosis not present

## 2017-01-17 DIAGNOSIS — I119 Hypertensive heart disease without heart failure: Secondary | ICD-10-CM | POA: Diagnosis not present

## 2017-01-17 DIAGNOSIS — R002 Palpitations: Secondary | ICD-10-CM | POA: Diagnosis not present

## 2017-01-17 DIAGNOSIS — R079 Chest pain, unspecified: Secondary | ICD-10-CM | POA: Insufficient documentation

## 2017-01-17 DIAGNOSIS — Z6841 Body Mass Index (BMI) 40.0 and over, adult: Secondary | ICD-10-CM | POA: Insufficient documentation

## 2017-01-17 MED ORDER — PERFLUTREN LIPID MICROSPHERE
1.0000 mL | INTRAVENOUS | Status: AC | PRN
  Administered 2017-01-17: 2 mL via INTRAVENOUS

## 2017-01-17 NOTE — Telephone Encounter (Signed)
-----   Message from Henrietta DineKathryn A Kemp, RN sent at 01/16/2017  9:52 AM EDT ----- Regarding: PSG PSG ordered for scheduling  ESS= 17

## 2017-01-17 NOTE — Telephone Encounter (Signed)
Informed patient of upcoming sleep study and patient understanding was verbalized. Patient understands his sleep study is scheduled for Monday March 10 2017. Patient understands his sleep study will be done at [facility] sleep lab. Patient understands he will receive a sleep packet in a week or so. Patient understands to call if he does not receive the sleep packet in a timely manner. Patient agrees with treatment and thanked me for call. 

## 2017-01-22 ENCOUNTER — Encounter: Payer: Self-pay | Admitting: Cardiology

## 2017-01-24 ENCOUNTER — Encounter: Payer: Self-pay | Admitting: Cardiology

## 2017-01-28 ENCOUNTER — Telehealth (HOSPITAL_COMMUNITY): Payer: Self-pay | Admitting: *Deleted

## 2017-01-28 ENCOUNTER — Telehealth (HOSPITAL_COMMUNITY): Payer: Self-pay | Admitting: Cardiology

## 2017-01-28 NOTE — Telephone Encounter (Signed)
Close encounter 

## 2017-01-29 NOTE — Telephone Encounter (Signed)
User: Trina AoGRIFFIN, Madigan Rosensteel A Date/time: 01/28/17 2:35 PM  Comment: Called pt and lmsg for him to CB to r/s ETT appt.   Context:  Outcome: Left Message  Phone number: [phone] Phone Type: Home Phone  Comm. type: Telephone Call type: Outgoing  Contact: Adine MaduraWright, Jacobie Relation to patient: Self

## 2017-01-30 ENCOUNTER — Ambulatory Visit (HOSPITAL_COMMUNITY)
Admission: RE | Admit: 2017-01-30 | Payer: No Typology Code available for payment source | Source: Ambulatory Visit | Attending: Cardiology | Admitting: Cardiology

## 2017-02-07 ENCOUNTER — Encounter: Payer: Self-pay | Admitting: Cardiology

## 2017-02-07 ENCOUNTER — Ambulatory Visit (HOSPITAL_COMMUNITY)
Admission: RE | Admit: 2017-02-07 | Discharge: 2017-02-07 | Disposition: A | Discharge status: Home / Self Care | Payer: No Typology Code available for payment source | Source: Ambulatory Visit | Attending: Cardiovascular Disease | Admitting: Cardiovascular Disease

## 2017-02-07 ENCOUNTER — Encounter (HOSPITAL_COMMUNITY): Payer: Self-pay | Admitting: *Deleted

## 2017-02-07 DIAGNOSIS — R079 Chest pain, unspecified: Secondary | ICD-10-CM

## 2017-02-07 NOTE — Progress Notes (Signed)
Test cancelled due to pt body habitus (440 lbs) the belt would not fit around pt.

## 2017-02-12 ENCOUNTER — Encounter: Payer: Self-pay | Admitting: Cardiology

## 2017-02-12 NOTE — Telephone Encounter (Signed)
I spoke with non invasive cardiology at [facility].  Weight limit for ETT is 350 lbs so they are unable to do ETT.

## 2017-02-13 NOTE — Telephone Encounter (Signed)
Please see the following patient email for responses.

## 2017-02-14 ENCOUNTER — Encounter: Payer: Self-pay | Admitting: Cardiology

## 2017-02-26 ENCOUNTER — Telehealth: Payer: Self-pay | Admitting: *Deleted

## 2017-02-26 DIAGNOSIS — R079 Chest pain, unspecified: Secondary | ICD-10-CM

## 2017-02-26 DIAGNOSIS — R0602 Shortness of breath: Secondary | ICD-10-CM

## 2017-02-26 NOTE — Telephone Encounter (Signed)
Lm to call back ./cy 

## 2017-02-26 NOTE — Telephone Encounter (Signed)
Lm to call re recommendations ./cy

## 2017-02-26 NOTE — Telephone Encounter (Signed)
Previous Messages    ----- Message -----  From: Quintella Reichert, MD  Sent: 02/25/2017  3:17 PM  To: [name], RN  Subject: RE: Stress test on bike              Please order the cardiopulmonary stress test - I think this would actually get [place] a lot of useful info   [name]  ----- Message -----  From: [name], RN  Sent: 02/17/2017 10:21 AM  To: Quintella Reichert, MD  Subject: Stress test on bike                TT- please see below. You had asked me to see if the hospital does a stress bike test (more specifically for Sailor Haughn dob 10/16/1965) and this is the closest thing. Let me know if you would like me to order it. It may benefit if you talked to Dr. Gala Romney too.   Let me know! Thanks!   [name]  ----- Message -----  From: Roderic Ovens  Sent: 02/17/2017 10:04 AM  To: [name], RN  Subject: RE: Help please!                 Hi [name],   I can definitely do a bike stress test, however, the big difference is that I extend the stress test analysis further with gas-exchange parameters and use a slightly different protocol to achieve a peak oxygen consumption. I monitor EKG, Oxygen sats, HR, BP, RPE. If you or Dr. Mayford Knife would like to talk further, I am available this afternoon. Also, maybe Dr. Mayford Knife can further discuss with Dr. Gala Romney. Weight isn't as limiting as actual leg length, abdominal girth and range of motion in obese patients. I can usually individualize/stratify and modify given weight and height.    Thanks,   [name]   ----- Message -----  From: [name], RN  Sent: 02/13/2017 12:04 PM  To: Roderic Ovens  Subject: Help please!                   Hello! Dr. Mayford Knife wants a cardiac stress test but on a bike instead of a treadmill. You are the only place I know with a bike!   1) is it possible to do a regular stress test instead of CPX there?  2) what is the weight limit?  3)  if this IS possible, what order do we place?   Thank you so much for your help!

## 2017-02-26 NOTE — Telephone Encounter (Signed)
  FW: Stress test on bike  Received: Today  Message Contents  [name], RN  P Cv Div Ch St Triage      Previous Messages    ----- Message -----  From: Quintella Reichert, MD  Sent: 02/25/2017  3:17 PM  To: [name], RN  Subject: RE: Stress test on bike              Please order the cardiopulmonary stress test - I think this would actually get [place] a lot of useful info   [name]  ----- Message -----  From: [name], RN  Sent: 02/17/2017 10:21 AM  To: Quintella Reichert, MD  Subject: Stress test on bike                TT- please see below. You had asked me to see if the hospital does a stress bike test (more specifically for John Osborn dob 12/25/1965) and this is the closest thing. Let me know if you would like me to order it. It may benefit if you talked to Dr. Gala Romney too.   Let me know! Thanks!   [name]  ----- Message -----  From: Roderic Ovens  Sent: 02/17/2017 10:04 AM  To: [name], RN  Subject: RE: Help please!                 Hi [name],   I can definitely do a bike stress test, however, the big difference is that I extend the stress test analysis further with gas-exchange parameters and use a slightly different protocol to achieve a peak oxygen consumption. I monitor EKG, Oxygen sats, HR, BP, RPE. If you or Dr. Mayford Knife would like to talk further, I am available this afternoon. Also, maybe Dr. Mayford Knife can further discuss with Dr. Gala Romney. Weight isn't as limiting as actual leg length, abdominal girth and range of motion in obese patients. I can usually individualize/stratify and modify given weight and height.    Thanks,   [name]   ----- Message -----  From: [name], RN  Sent: 02/13/2017 12:04 PM  To: Roderic Ovens  Subject: Help please!                   Hello! Dr. Mayford Knife wants a cardiac stress test but on a bike instead of a treadmill. You are the only place I know  with a bike!   1) is it possible to do a regular stress test instead of CPX there?  2) what is the weight limit?  3) if this IS possible, what order do we place?   Thank you so much for your help!   [name]

## 2017-02-27 NOTE — Telephone Encounter (Signed)
Reviewed orders of CPX from Dr Mayford Knife with patient.  He is aware the order will be placed and a scheduler will c/b to assist scheduling the appointment.  Advised it is done at [facility] and it takes approximately 2 hours to be completed.  He states understanding.

## 2017-02-27 NOTE — Telephone Encounter (Signed)
Follow up ° ° °Pt is returning call to nurse.  °

## 2017-02-27 NOTE — Telephone Encounter (Signed)
Left message to call back  

## 2017-03-10 ENCOUNTER — Ambulatory Visit (HOSPITAL_BASED_OUTPATIENT_CLINIC_OR_DEPARTMENT_OTHER): Payer: No Typology Code available for payment source | Attending: Cardiology | Admitting: Cardiology

## 2017-03-10 VITALS — Ht 71.0 in | Wt >= 6400 oz

## 2017-03-10 DIAGNOSIS — Z9989 Dependence on other enabling machines and devices: Secondary | ICD-10-CM

## 2017-03-10 DIAGNOSIS — G4733 Obstructive sleep apnea (adult) (pediatric): Secondary | ICD-10-CM | POA: Insufficient documentation

## 2017-03-11 ENCOUNTER — Encounter: Payer: Self-pay | Admitting: Cardiology

## 2017-03-11 NOTE — Procedures (Signed)
Patient awoke at 1:49 unable to return back asleep and requested to discontinued sleep study and to go home. Patient had complained of over-all discomfort especially with his back stating he sleeps on a bariatric mattress at home. Also patient had complaints of not having his cpap devise applied while having his sleep study. Tech explained to patient what type of study he was ordered for at upon arrival at sleep center ( split night study) and again when patient requested to discontinued sleep testing at 1:49 am. Thus, at this point patient was given the leaving the hospital against medical advice form. Patient refused to sign the form and left the sleep center once he was unhooked at 2:17 am. And left the sleep center at 2:20 am Note Light's on was at 2:17 am     Thanks  Ulyess MortVicki Evaline Waltman, RPSGT, RRT, RST

## 2017-03-12 ENCOUNTER — Telehealth: Payer: Self-pay | Admitting: *Deleted

## 2017-03-12 NOTE — Telephone Encounter (Signed)
-----   Message from Quintella Reichertraci R Turner, MD sent at 03/12/2017  4:21 PM EDT ----- Regarding: RE: LEAVING AMA Please call patient and find out why he left sleep study.  Please let him know that the first 2 hours have to be without CPAP to document his OSA and then CPAP is applied  Traci ----- Message ----- From: Terrial RhodesSpruill, Vicki M, RPSGT Sent: 03/11/2017   2:37 AM To: Quintella Reichertraci R Turner, MD Subject: LEAVING AMA                                    Patient left AMA due to over-all discomfort and not having his cpap applied during the night.

## 2017-03-12 NOTE — Telephone Encounter (Signed)
Patient states he didn't feel safe without his CPAP he wears it every night for 20 years now. Patient states he needed a bariatric bed so the bed he had was not comfortable to sleep in. You asked the technician for his CPAP after 3 hours and she declined to put him on it.

## 2017-03-20 NOTE — Procedures (Signed)
   Patient Name: John Osborn, John Osborn Study Date: 03/10/2017 Gender: Male D.O.B: 04-23-1966 Age (years): [age] Referring Provider: Armanda Magicraci Turner MD, ABSM Height (inches): 71 Interpreting Physician: Armanda Magicraci Turner MD, ABSM Weight (lbs): 430 RPSGT: Ulyess MortSpruill, Vicki BMI: 60 MRN: [id number] Neck Size: 21.50  CLINICAL INFORMATION Sleep Study Type: NPSG  Indication for sleep study: Excessive Daytime Sleepiness, Fatigue, Hypertension, Obesity, OSA, Snoring  Epworth Sleepiness Score: 12  SLEEP STUDY TECHNIQUE As per the AASM Manual for the Scoring of Sleep and Associated Events v2.3 (April 2016) with a hypopnea requiring 4% desaturations.  The channels recorded and monitored were frontal, central and occipital EEG, electrooculogram (EOG), submentalis EMG (chin), nasal and oral airflow, thoracic and abdominal wall motion, anterior tibialis EMG, snore microphone, electrocardiogram, and pulse oximetry.  MEDICATIONS Medications self-administered by patient taken the night of the study : N/A  SLEEP ARCHITECTURE The study was initiated at 10:45:22 PM and ended at 2:17:56 AM.  Sleep onset time was 16.4 minutes and the sleep efficiency was 55.7%. The total sleep time was 118.5 minutes.  Stage REM latency was N/A minutes.  The patient spent 21.94% of the night in stage N1 sleep, 78.06% in stage N2 sleep, 0.00% in stage N3 and 0.00% in REM.  Alpha intrusion was absent.  Supine sleep was 100.00%.  RESPIRATORY PARAMETERS The overall apnea/hypopnea index (AHI) was 100.3 per hour. There were 169 total apneas, including 169 obstructive, 0 central and 0 mixed apneas. There were 29 hypopneas and 11 RERAs.  The AHI during Stage REM sleep was N/A per hour.  AHI while supine was 100.3 per hour.  The mean oxygen saturation was 95.14%. The minimum SpO2 during sleep was 86.00%.  moderate snoring was noted during this study.  CARDIAC DATA The 2 lead EKG demonstrated sinus rhythm. The mean heart rate was  71.70 beats per minute. Other EKG findings include: PVCs.  LEG MOVEMENT DATA The total PLMS were 0 with a resulting PLMS index of 0.00. Associated arousal with leg movement index was 0.0 .  IMPRESSIONS - Severe obstructive sleep apnea occurred during this study (AHI = 100.3/h). - No significant central sleep apnea occurred during this study (CAI = 0.0/h). - Moderate oxygen desaturation was noted during this study (Min O2 = 86.00%). - The patient snored with moderate snoring volume. - EKG findings include PVCs. - Clinically significant periodic limb movements did not occur during sleep. No significant associated arousals.  DIAGNOSIS - Obstructive Sleep Apnea (327.23 [G47.33 ICD-10])  RECOMMENDATIONS - Therapeutic CPAP titration to determine optimal pressure required to alleviate sleep disordered breathing. - Positional therapy avoiding supine position during sleep. - Avoid alcohol, sedatives and other CNS depressants that may worsen sleep apnea and disrupt normal sleep architecture. - Sleep hygiene should be reviewed to assess factors that may improve sleep quality. - Weight management and regular exercise should be initiated or continued if appropriate.  Armanda Magicraci Turner Diplomate, American Board of Sleep Medicine  ELECTRONICALLY SIGNED ON:  03/20/2017, 9:43 PM  [facility] PH: [phone]   FX: [phone] ACCREDITED BY THE AMERICAN ACADEMY OF SLEEP MEDICINE

## 2017-03-26 ENCOUNTER — Telehealth: Payer: Self-pay | Admitting: Cardiology

## 2017-03-26 NOTE — Telephone Encounter (Signed)
New message    Pt sent an appt request to see Dr. Mayford Knifeurner. He said he wants to follow up on some outstanding test that he is to do, one is his sleep study. He did not want to wait until January to see Dr. Mayford Knifeurner. He asked for a call from RN.

## 2017-03-27 ENCOUNTER — Telehealth: Payer: Self-pay | Admitting: *Deleted

## 2017-03-27 NOTE — Telephone Encounter (Signed)
Informed patient of sleep study results and patient understanding was verbalized. Patient understands he has severe sleep apnea and Dr Mayford Knifeurner recommends a CPAP titration to treat his OSA.  Patient wants an at home 02 test that he wears at night in his own bariatric bed. Patient would be willing to do the titration test but he needs the pressure above 9cm because he is titrated on 15 cm. Sleep center refused to go above 9 cm for him so he went home. Patient states he delt like he had nothing with the pressure on 9 cm.

## 2017-03-27 NOTE — Telephone Encounter (Signed)
    Informed patient of sleep study results and patient understanding was verbalized. Patient understands he has severe sleep apnea and Dr Mayford Knifeurner recommends a CPAP titration to treat his OSA.  Patient wants an at home 02 test that he wears at night in his own bariatric bed. Patient would be willing to do the titration test but he needs the pressure above 9cm because he is titrated on 15 cm. Sleep center refused to go above 9 cm for him so he went home. Patient states he delt like he had nothing with the pressure on 9 cm.

## 2017-03-27 NOTE — Telephone Encounter (Signed)
Informed patient that he has an order for CPX test and a message was sent to our schedulers to contact him to schedule that appt, and he will receive a call from [place] regarding sleep study results. Patient verbalized understanding and thanked me for he call.

## 2017-03-27 NOTE — Telephone Encounter (Signed)
-----   Message from Quintella Reichertraci R Turner, MD sent at 03/20/2017  9:46 PM EDT ----- Please let patient know that they have severe sleep apnea and recommend CPAP titration. Please set up titration in the sleep lab.

## 2017-03-27 NOTE — Telephone Encounter (Signed)
LMTCB

## 2017-03-28 ENCOUNTER — Encounter: Payer: Self-pay | Admitting: *Deleted

## 2017-03-28 NOTE — Telephone Encounter (Addendum)
RE: LEAVING AMA  Turner, Cornelious Bryantraci R, MD  Reesa ChewJones, Chevy Sweigert G, CMA        Please find out if insurance will pay for just a CPAP titration   Traci   Previous Messages    ----- Message -----  From: Reesa ChewJones, Noelie Renfrow G, CMA  Sent: 03/12/2017  5:06 PM  To: Quintella Reichertraci R Turner, MD  Subject: RE: LEAVING AMA                  Patient states he didn't feel safe without his CPAP he wears it every night for 20 years now.  Patient states he needed a bariatric bed so the bed he had was not comfortable to sleep in.  You asked the technician for his CPAP after 3 hours and she declined to put him on it.  Thanks  ----- Message -----  From: Quintella Reicherturner, Traci R, MD  Sent: 03/12/2017  4:21 PM  To: Reesa Cheworothea G Brazil Voytko, CMA  Subject: RE: LEAVING AMA                  Please call patient and find out why he left sleep study. Please let him know that the first 2 hours have to be without CPAP to document his OSA and then CPAP is applied   Traci  ----- Message -----  From: Terrial RhodesSpruill, Vicki M, RPSGT  Sent: 03/11/2017  2:37 AM  To: Quintella Reichertraci R Turner, MD  Subject: LEAVING AMA                    Patient left AMA due to over-all discomfort and not having his cpap applied during the night.

## 2017-03-30 NOTE — Telephone Encounter (Signed)
John Osborn, can we get a download on his current device to see what his AHI is at 15cm H2O

## 2017-03-31 NOTE — Telephone Encounter (Signed)
[facility] to inquire if [facility] could get a download for our office as the patient does not have a card in his unit. Jasmine DecemberSharon asked for all information and she will contact the patient and obtain the download for our office and fax it to us. Patient notified to expect a call from [facility]. Patient agrees with treatment and thanked me for the call.

## 2017-04-07 ENCOUNTER — Encounter: Payer: Self-pay | Admitting: Cardiology

## 2017-04-07 DIAGNOSIS — Z9989 Dependence on other enabling machines and devices: Principal | ICD-10-CM

## 2017-04-07 DIAGNOSIS — G4733 Obstructive sleep apnea (adult) (pediatric): Secondary | ICD-10-CM

## 2017-04-08 ENCOUNTER — Telehealth: Payer: Self-pay | Admitting: Cardiology

## 2017-04-08 NOTE — Telephone Encounter (Signed)
Called patient to explain that I have sent Dr Mayford Knifeurner a message concerning his question about a home titration since he can not tolerate an in lab test or the beds they have in the lab that are too small for him. lmtcb

## 2017-04-08 NOTE — Telephone Encounter (Signed)
Correction: Dr Mayford Knifeurner patient sates he will go back and do the CPAP Titration you ordered for him.

## 2017-04-08 NOTE — Telephone Encounter (Signed)
New message  Pt call requesting to speak with RN about his cpap machine. Please call back to discuss

## 2017-04-15 NOTE — Telephone Encounter (Signed)
[facility] Jasmine December(Sharon) called to ask if we could put the patient on auto CPAP at 5 to 20 for 2 weeks because the patient really does not want to go back to the lab again then she will send our office a download with his pressure setting and process his request for a new CPAP.

## 2017-04-15 NOTE — Telephone Encounter (Signed)
That is fine 

## 2017-04-17 ENCOUNTER — Ambulatory Visit (HOSPITAL_COMMUNITY): Payer: No Typology Code available for payment source | Attending: Cardiology

## 2017-04-17 DIAGNOSIS — E669 Obesity, unspecified: Secondary | ICD-10-CM | POA: Diagnosis not present

## 2017-04-17 DIAGNOSIS — R0602 Shortness of breath: Secondary | ICD-10-CM | POA: Diagnosis not present

## 2017-04-17 DIAGNOSIS — Z6841 Body Mass Index (BMI) 40.0 and over, adult: Secondary | ICD-10-CM | POA: Insufficient documentation

## 2017-04-17 DIAGNOSIS — R079 Chest pain, unspecified: Secondary | ICD-10-CM | POA: Diagnosis not present

## 2017-04-23 ENCOUNTER — Encounter: Payer: Self-pay | Admitting: Cardiology

## 2017-05-30 IMAGING — CT CT RENAL STONE PROTOCOL
2 of 4 series · 17 of 46 positions shown, 19 images · non-contrast
Comparison: CT scan dated 03/19/2014

CLINICAL DATA: Left flank pain and nausea since 2 a.m. today.
History kidney stones.

EXAM:
CT ABDOMEN AND PELVIS WITHOUT CONTRAST
TECHNIQUE: Multidetector CT imaging of the abdomen and pelvis was performed
following the standard protocol without IV contrast.

[Series 2: axial st · axial · 0.98mm/px · z∈[-522,-67]mm · 14 of 101 slices shown, 16 images]
[im 5/101  soft-tissue]
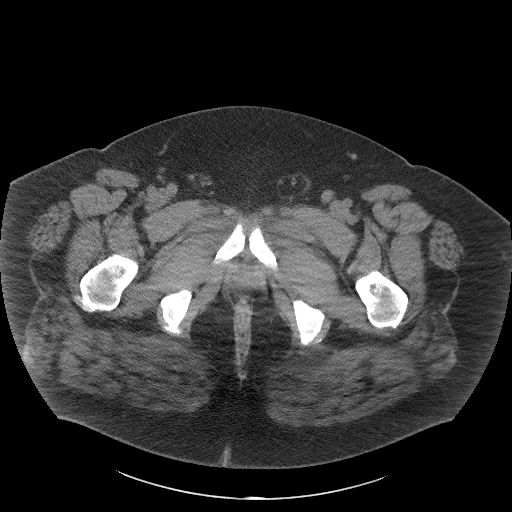
[im 5/101  bone]
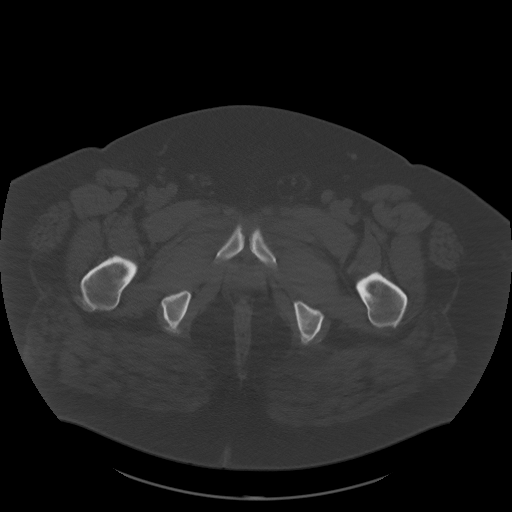
[im 14/101  soft-tissue]
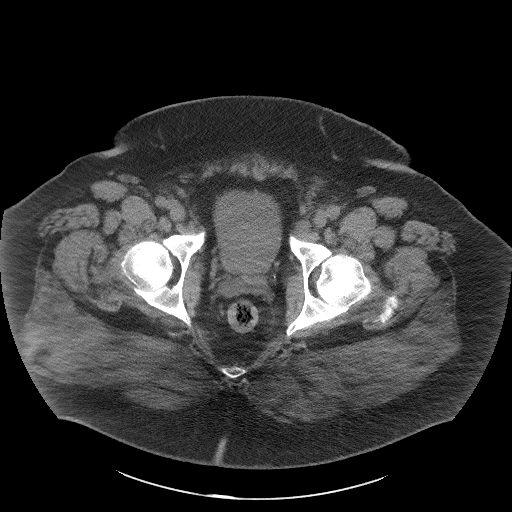
[im 19/101  soft-tissue]
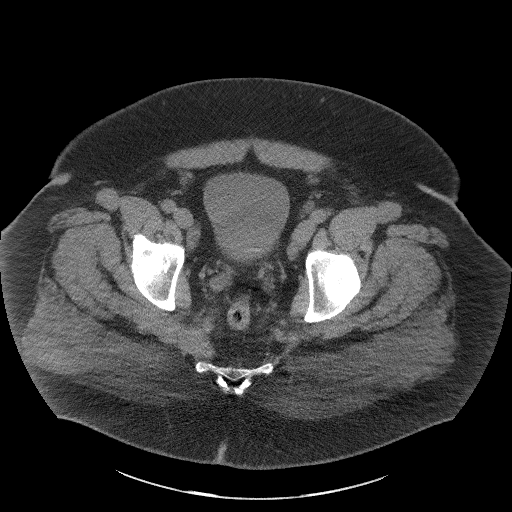
[im 28/101  soft-tissue]
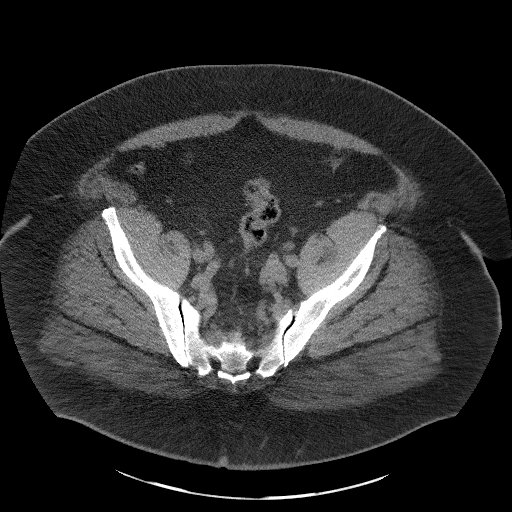
[im 32/101  soft-tissue]
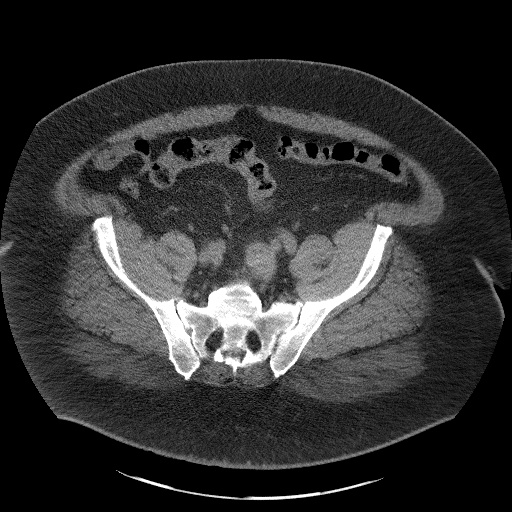
[im 41/101  soft-tissue]
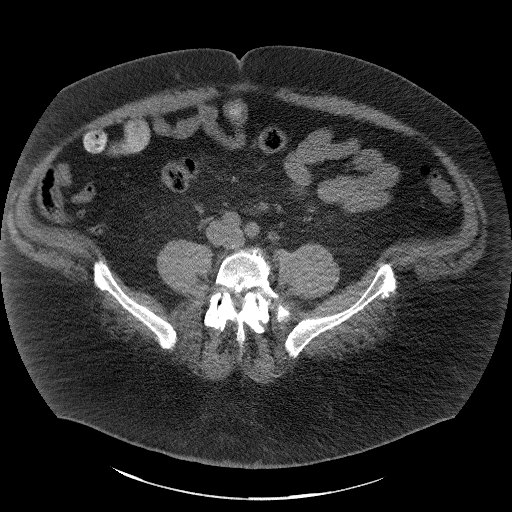
[im 46/101  soft-tissue]
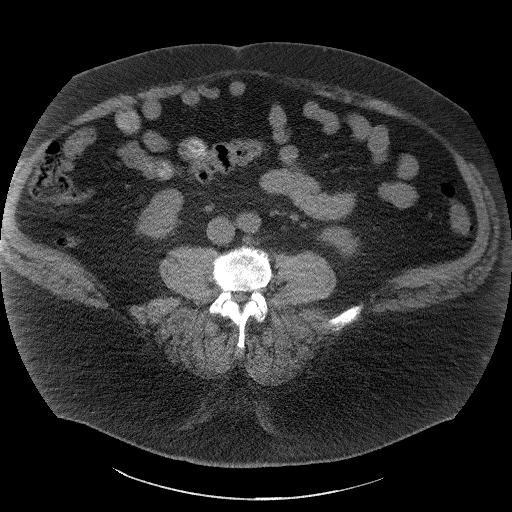
[im 55/101  soft-tissue]
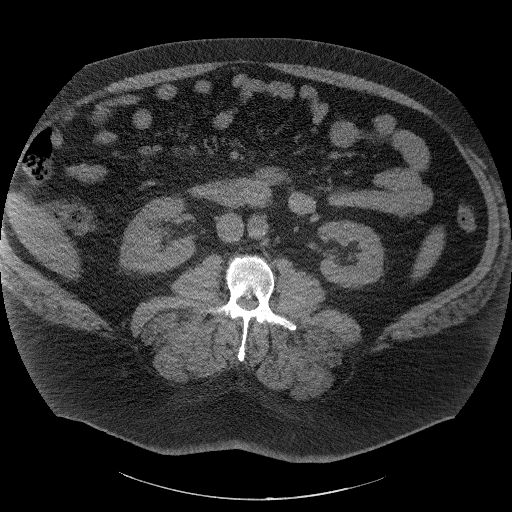
[im 60/101  soft-tissue]
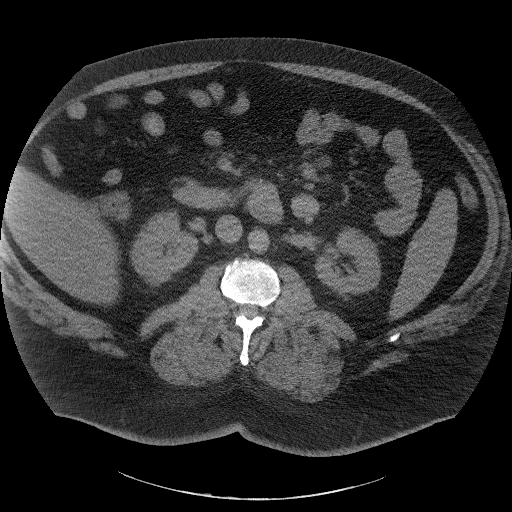
[im 60/101  bone]
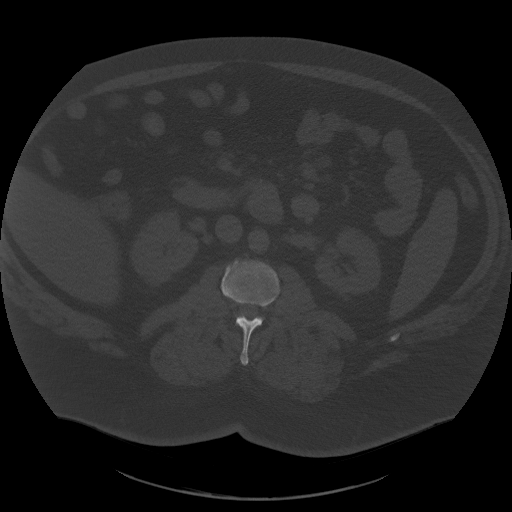
[im 69/101  soft-tissue]
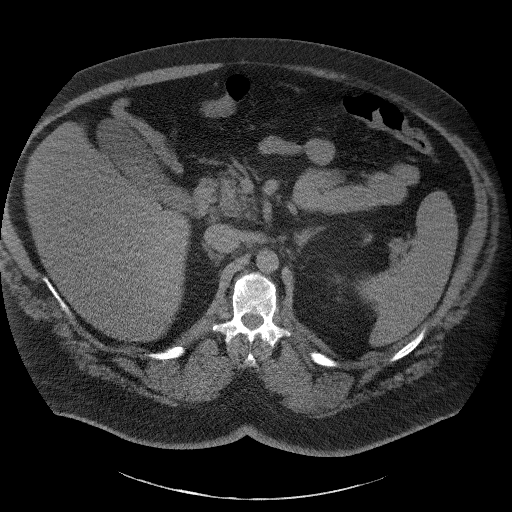
[im 73/101  soft-tissue]
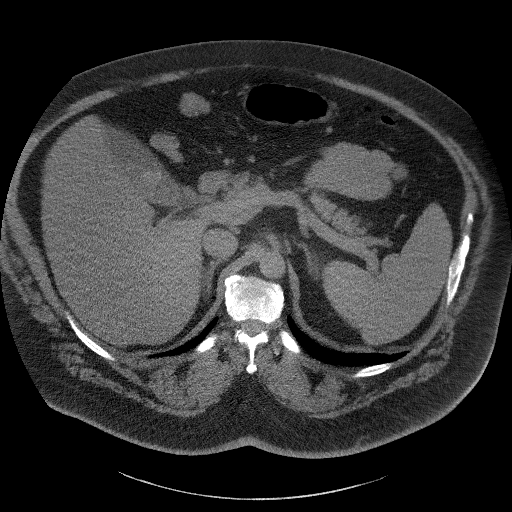
[im 82/101  soft-tissue]
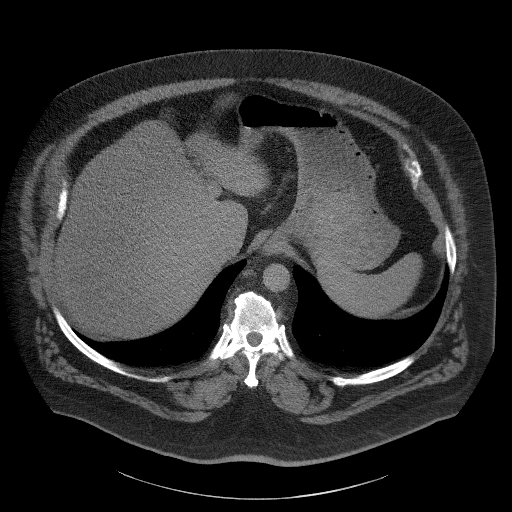
[im 87/101  soft-tissue]
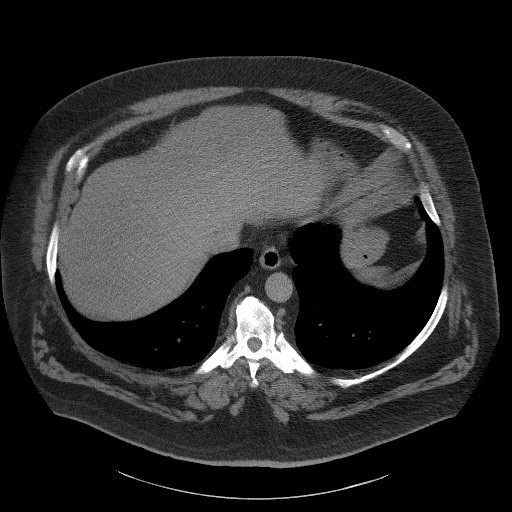
[im 96/101  soft-tissue]
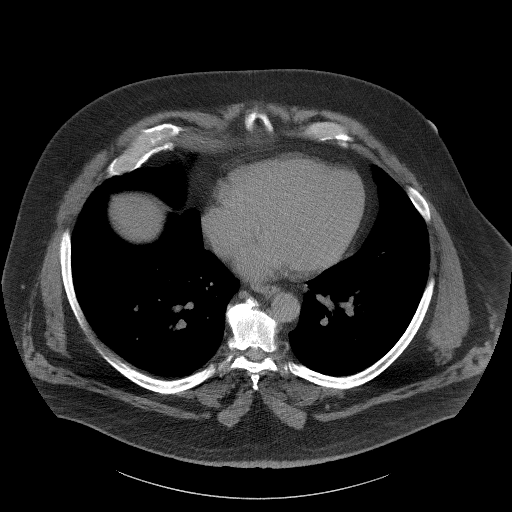

[Series 5: coronal st · coronal · 1.00mm/px · 3 of 163 slices shown]
[im 55/163  soft-tissue]
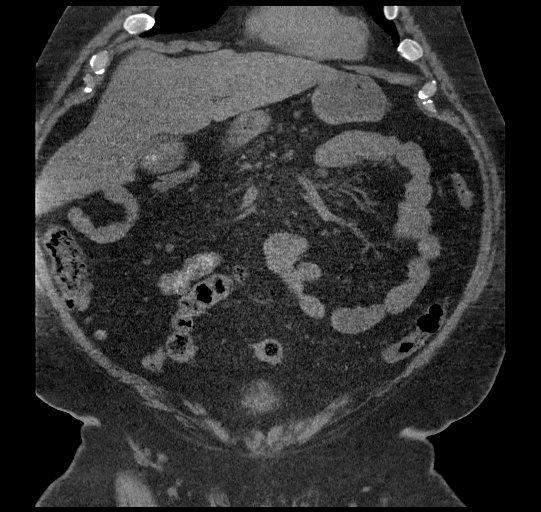
[im 73/163  soft-tissue]
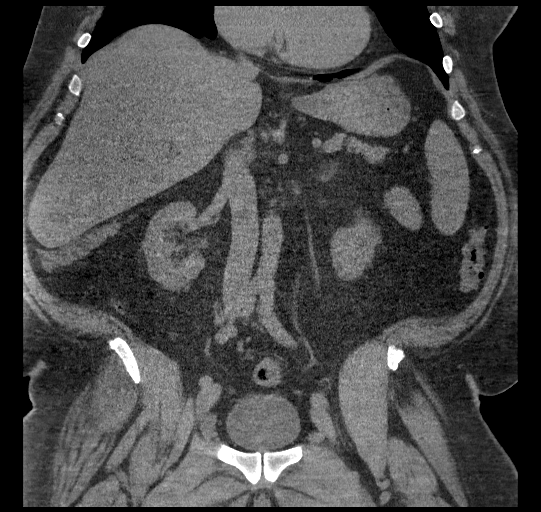
[im 91/163  soft-tissue]
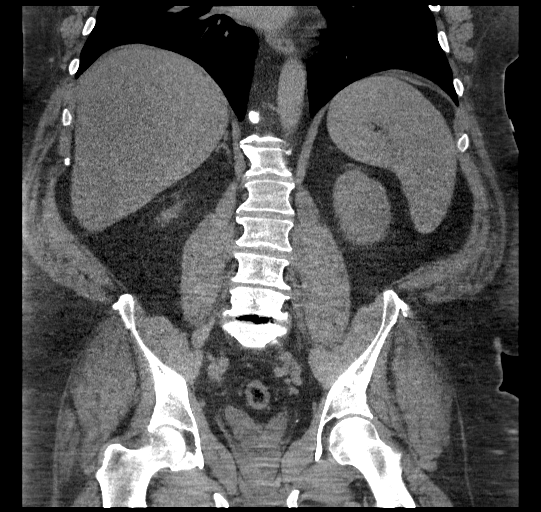

[17 of 46 positions shown; findings below may reference images not displayed]

FINDINGS: Lower chest:  Normal.

Hepatobiliary: The liver parenchyma is normal. Multiple gallstones
more apparent than on the prior study. No bile duct dilatation.

Pancreas: No mass or inflammatory process identified on this
un-enhanced exam.

Spleen: Within normal limits in size.

Adrenals/Urinary Tract: Small stones in the lower pole of each
kidney. No ureteral or bladder calculi.

Stomach/Bowel: No evidence of obstruction, inflammatory process, or
abnormal fluid collections.

Vascular/Lymphatic: No pathologically enlarged lymph nodes. No
evidence of abdominal aortic aneurysm.

Reproductive: No mass or other significant abnormality.

Other: None.

Musculoskeletal: No acute abnormality. Severe degenerative disc
disease and joint disease at L5-S1.
IMPRESSION: 1. Solitary small stones in the lower pole of each kidney. No
ureteral or bladder calculi.
2. Chronic cholelithiasis.

## 2017-05-30 IMAGING — DX DG CHEST 2V
2 series · 2 of 2 positions shown · non-contrast
Comparison: 04/17/2013

CLINICAL DATA: Left lateral abdominal pain and left flank pain.

EXAM:
CHEST  2 VIEW

[chest pa]
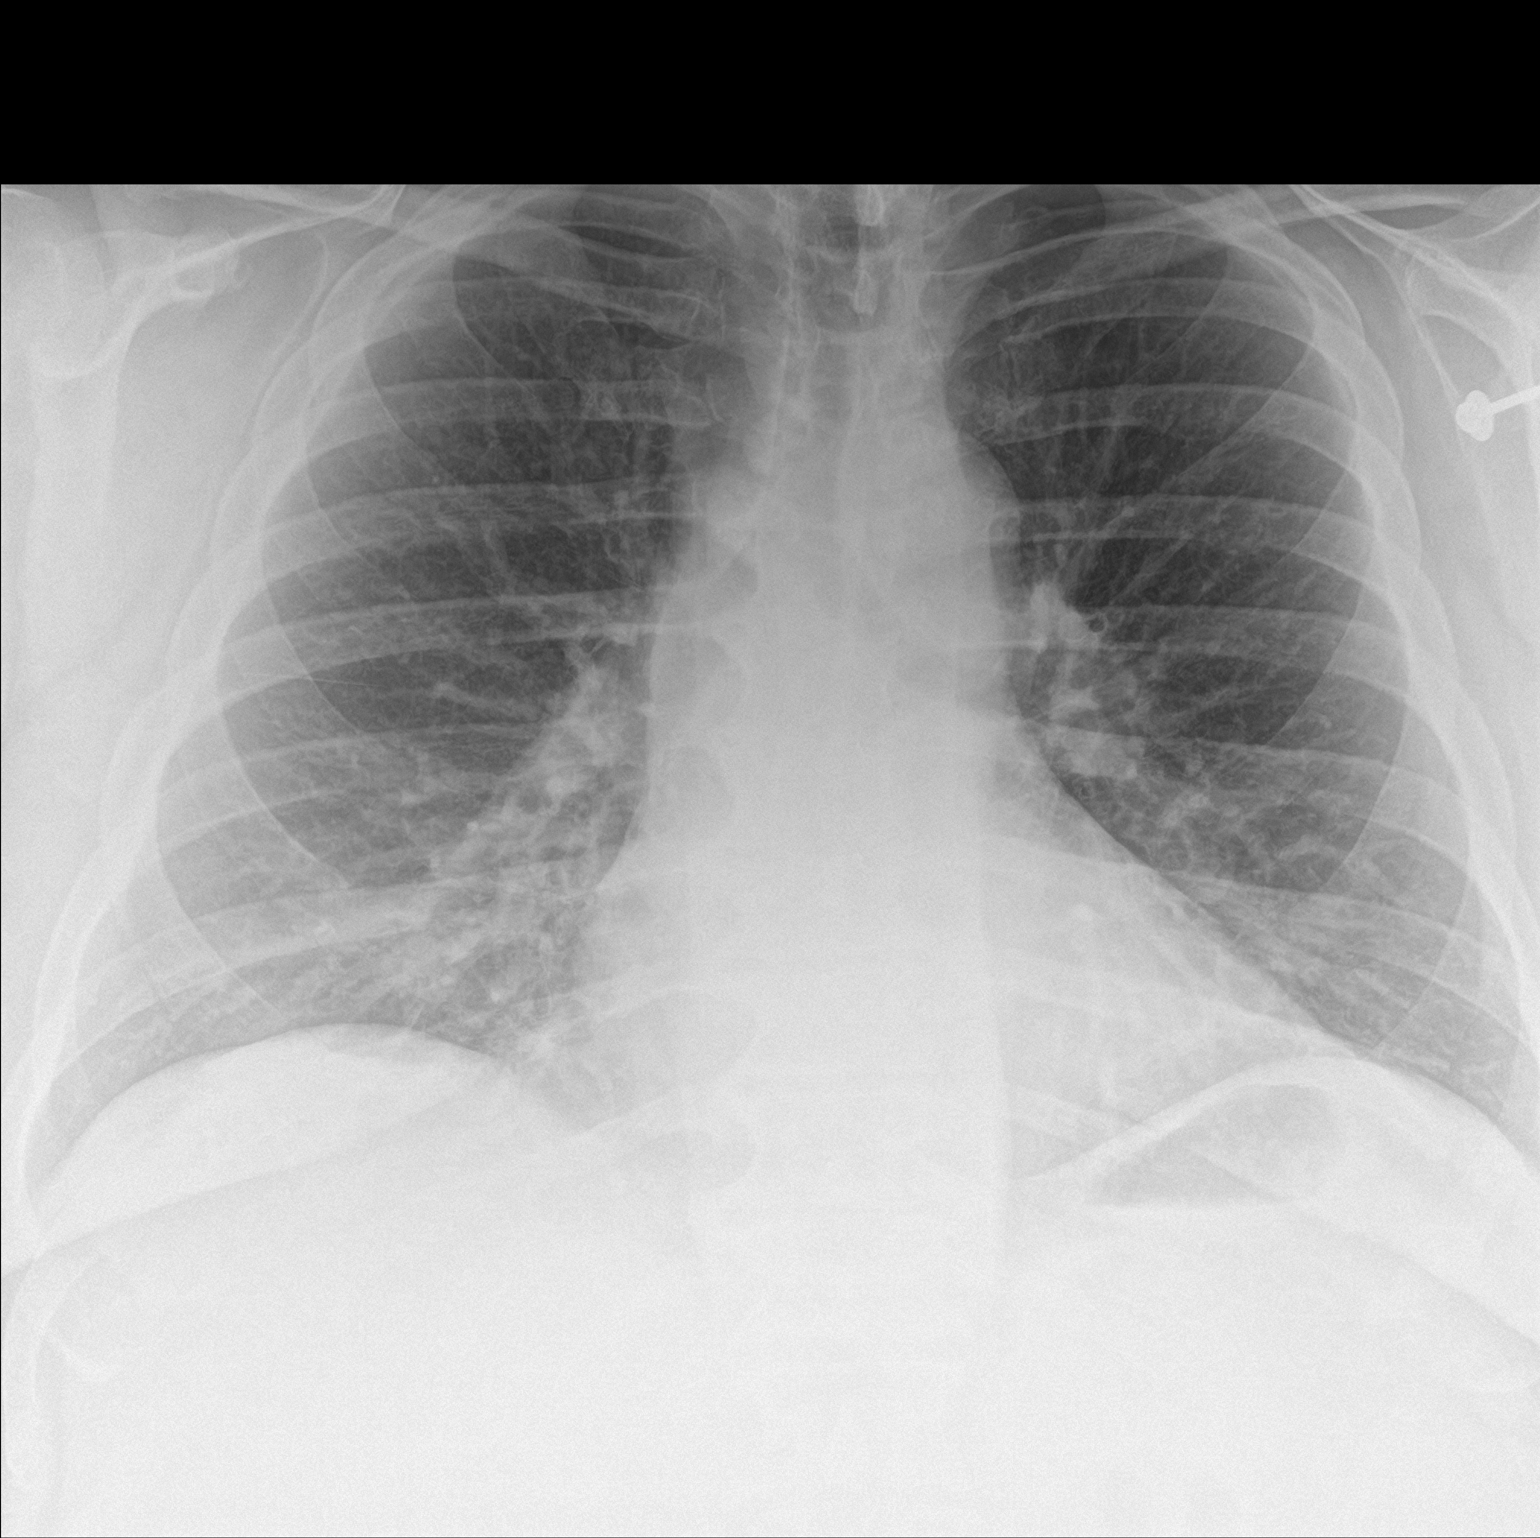

[chest lat]
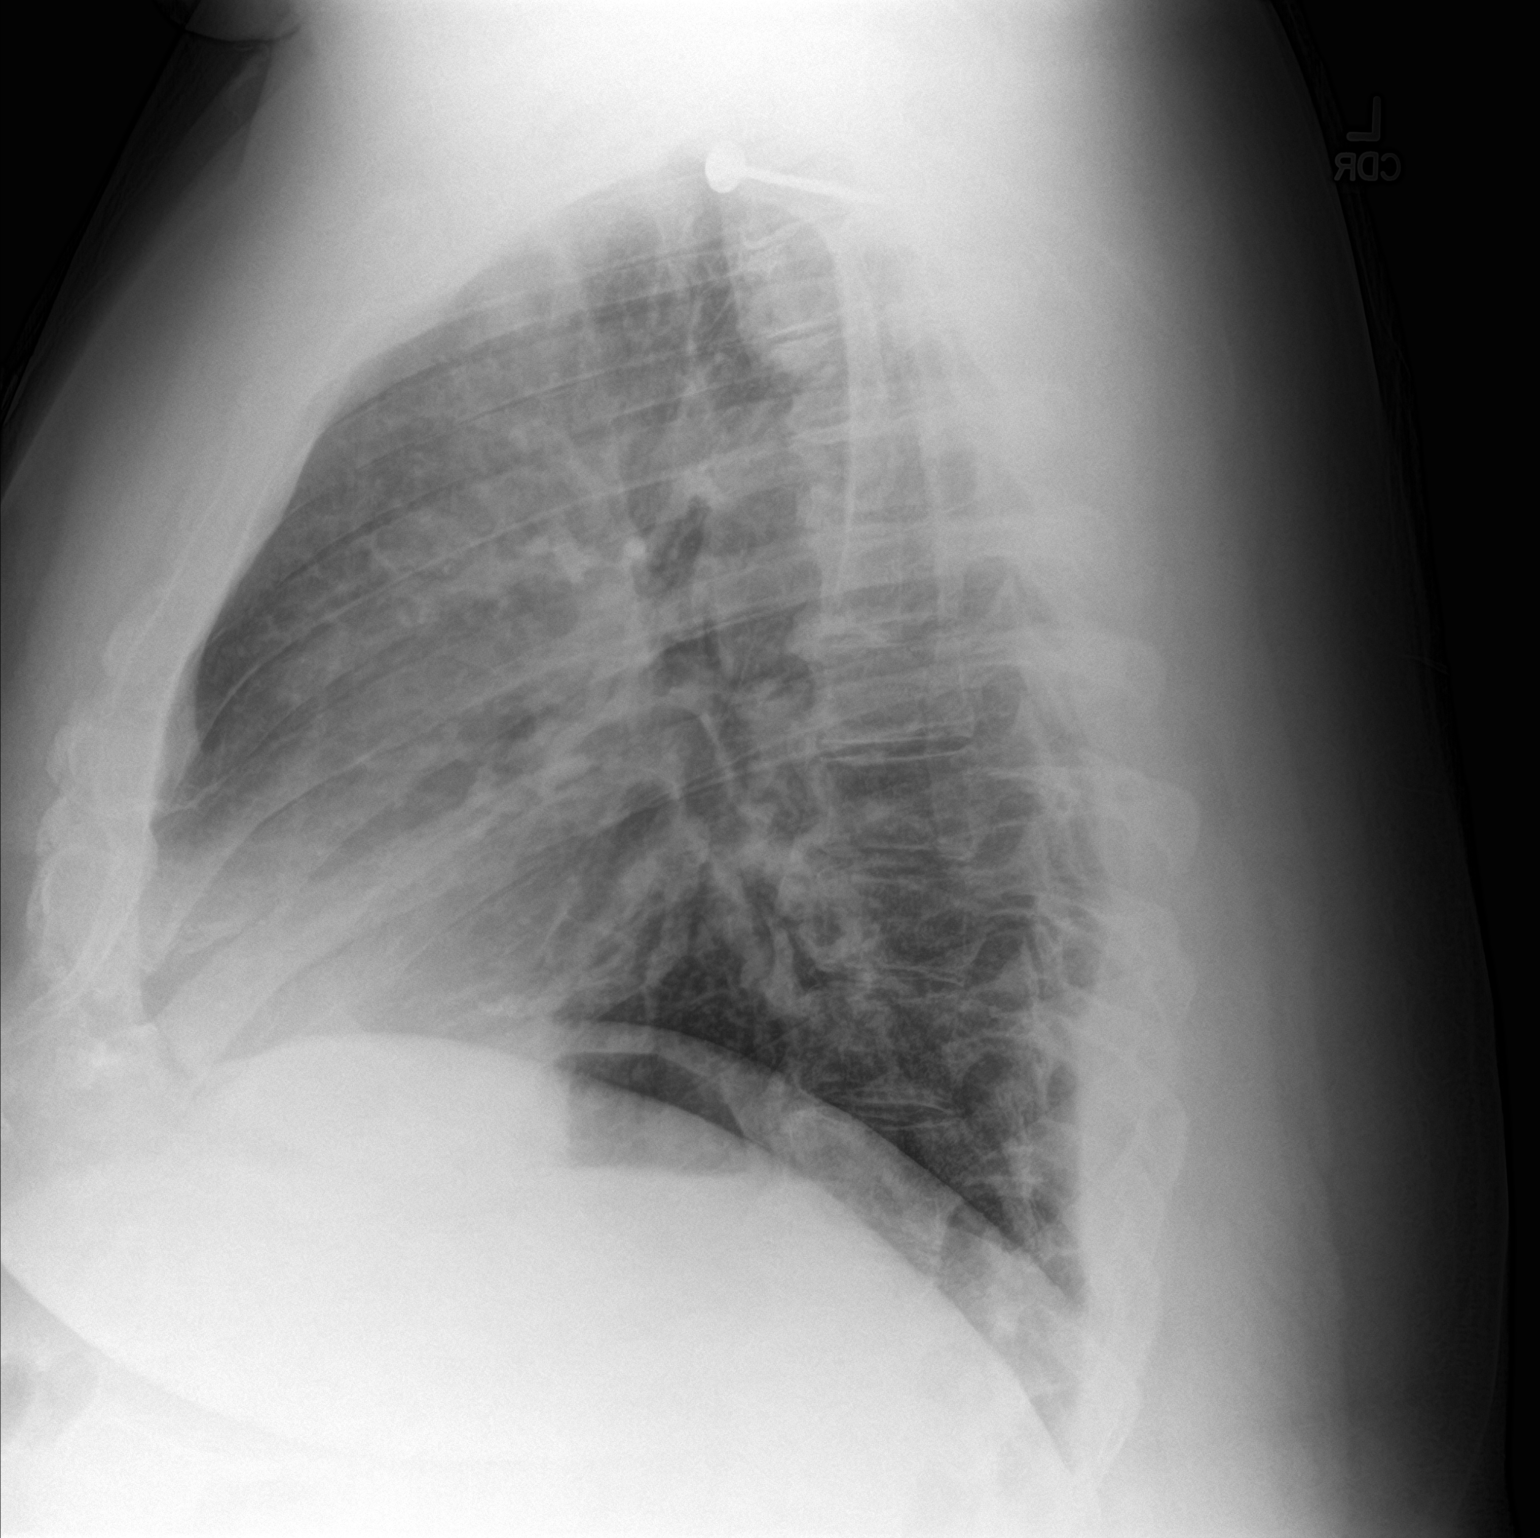

[2 of 2 positions shown; findings below may reference images not displayed]

FINDINGS: The heart size and mediastinal contours are within normal limits.
Both lungs are clear. Fixation screw in the left scapula. No acute
bone abnormality.
IMPRESSION: No active cardiopulmonary disease.

## 2017-07-07 ENCOUNTER — Encounter: Payer: Self-pay | Admitting: Cardiology

## 2017-08-04 ENCOUNTER — Encounter: Payer: Self-pay | Admitting: Cardiology

## 2017-08-20 ENCOUNTER — Telehealth: Payer: Self-pay | Admitting: Cardiology

## 2017-08-20 DIAGNOSIS — G4733 Obstructive sleep apnea (adult) (pediatric): Secondary | ICD-10-CM

## 2017-08-20 DIAGNOSIS — Z9989 Dependence on other enabling machines and devices: Secondary | ICD-10-CM

## 2017-08-20 NOTE — Telephone Encounter (Signed)
°  New message  Pt verbalized that he is calling for RN  Because he feels that he needs an adjustment on his C-PAP  Pt recall is 12/2017

## 2017-08-22 ENCOUNTER — Telehealth: Payer: Self-pay | Admitting: *Deleted

## 2017-08-22 NOTE — Telephone Encounter (Signed)
I would like a 2 week CPAP autotration from 4 to 18cm H2o

## 2017-08-22 NOTE — Telephone Encounter (Signed)
RECIEVED MESSAGE FROM PRE CERT THAT NO PRIOR AUTH IS NEEDED FOR PATIENT TITRATION. LMTCB FOR PATIENT TO GET SCHEDULED.

## 2017-08-22 NOTE — Telephone Encounter (Signed)
Patient called back to explain that he is on a variable pressure setting from 10-20 and has been for the last several months. It is the patients understanding that we were to give him a definitve pressure range within a couple of months of his october 2018 sleep study .   Per Dr Malachy Moodurners recommendations on the October  sleep study she wanted a therapeutic CPAP titration which an in lab study. Per her February communication with the patient she states she wanted a auto titration which is an at home study. I will confirm with Dr Mayford Knifeurner and get orders for patient to have auto titration with [facility]. Patient is aware and agreeable.

## 2017-08-22 NOTE — Telephone Encounter (Signed)
[name] notified no Pre cert needed for titration study per [name] @ [facility] . Okay to schedule.

## 2017-08-26 NOTE — Addendum Note (Signed)
Addended by: Reesa ChewJONES, Tashunda Vandezande G on: 08/26/2017 01:15 PM   Modules accepted: Orders

## 2017-08-26 NOTE — Telephone Encounter (Signed)
Order faxed to [facility]. 

## 2017-08-29 ENCOUNTER — Telehealth: Payer: Self-pay | Admitting: Cardiology

## 2017-08-29 NOTE — Telephone Encounter (Signed)
New message  Patient calling requesting a response to his MyChart message sent.    1) What problem are you experiencing? Wants cpap settings changed, patient states settings are too low  2) Who is your medical equipment company? [facility]   Please route to the sleep study assistant.

## 2017-09-01 ENCOUNTER — Encounter: Payer: Self-pay | Admitting: Cardiology

## 2017-09-05 NOTE — Telephone Encounter (Signed)
Mr Elbaum,  I reviewed your chart with Dr Turner and she is agreeable to changing your pressure setting range from 10-20 cm H20. I have forwarded the order to Sharon at [facility]. If your have any further questions please reach out to me.            

## 2017-09-05 NOTE — Telephone Encounter (Signed)
Mr Delford FieldWright,  I reviewed your chart with Dr Mayford Knifeurner and she is agreeable to changing your pressure setting range from 10-20 cm H20. [facility]. If your have any further questions please reach out to me.

## 2017-09-26 ENCOUNTER — Telehealth: Payer: Self-pay | Admitting: *Deleted

## 2017-09-26 NOTE — Telephone Encounter (Signed)
Patient is aware and agreeable to AHI being within range at 5.0. Patient is aware and agreeable to improving compliance with machine usage Patient states his compliance may appear to be down due to his current machine settings being wrong in the past. Therefore he was using an old machine. Now that his current settings are correct he is agreeable to compliance.

## 2017-09-26 NOTE — Telephone Encounter (Signed)
-----   Message from Quintella Reichert, MD sent at 09/25/2017  7:41 PM EDT ----- Good AHI on PAP but needs to improve compliance

## 2017-12-04 ENCOUNTER — Encounter: Payer: Self-pay | Admitting: Cardiology

## 2018-07-26 ENCOUNTER — Emergency Department (HOSPITAL_BASED_OUTPATIENT_CLINIC_OR_DEPARTMENT_OTHER)
Admission: EM | Admit: 2018-07-26 | Discharge: 2018-07-26 | Disposition: A | Discharge status: Home / Self Care | Payer: No Typology Code available for payment source | Attending: Emergency Medicine | Admitting: Emergency Medicine

## 2018-07-26 ENCOUNTER — Encounter (HOSPITAL_BASED_OUTPATIENT_CLINIC_OR_DEPARTMENT_OTHER): Payer: Self-pay | Admitting: Emergency Medicine

## 2018-07-26 ENCOUNTER — Other Ambulatory Visit: Payer: Self-pay

## 2018-07-26 ENCOUNTER — Emergency Department (HOSPITAL_BASED_OUTPATIENT_CLINIC_OR_DEPARTMENT_OTHER): Payer: No Typology Code available for payment source

## 2018-07-26 DIAGNOSIS — Z79899 Other long term (current) drug therapy: Secondary | ICD-10-CM | POA: Insufficient documentation

## 2018-07-26 DIAGNOSIS — R109 Unspecified abdominal pain: Secondary | ICD-10-CM | POA: Diagnosis present

## 2018-07-26 DIAGNOSIS — R1012 Left upper quadrant pain: Secondary | ICD-10-CM | POA: Insufficient documentation

## 2018-07-26 DIAGNOSIS — R1032 Left lower quadrant pain: Secondary | ICD-10-CM | POA: Insufficient documentation

## 2018-07-26 LAB — CBC WITH DIFFERENTIAL/PLATELET
Abs Immature Granulocytes: 0.04 10*3/uL (ref 0.00–0.07)
BASOS PCT: 0 %
Basophils Absolute: 0.1 10*3/uL (ref 0.0–0.1)
Eosinophils Absolute: 0.1 10*3/uL (ref 0.0–0.5)
Eosinophils Relative: 1 %
HCT: 44 % (ref 39.0–52.0)
Hemoglobin: 14.8 g/dL (ref 13.0–17.0)
Immature Granulocytes: 0 %
Lymphocytes Relative: 12 %
Lymphs Abs: 1.4 10*3/uL (ref 0.7–4.0)
MCH: 29.9 pg (ref 26.0–34.0)
MCHC: 33.6 g/dL (ref 30.0–36.0)
MCV: 88.9 fL (ref 80.0–100.0)
MONOS PCT: 4 %
Monocytes Absolute: 0.5 10*3/uL (ref 0.1–1.0)
Neutro Abs: 9.5 10*3/uL — ABNORMAL HIGH (ref 1.7–7.7)
Neutrophils Relative %: 83 %
Platelets: 237 10*3/uL (ref 150–400)
RBC: 4.95 MIL/uL (ref 4.22–5.81)
RDW: 13.7 % (ref 11.5–15.5)
WBC: 11.5 10*3/uL — ABNORMAL HIGH (ref 4.0–10.5)
nRBC: 0 % (ref 0.0–0.2)

## 2018-07-26 LAB — COMPREHENSIVE METABOLIC PANEL
ALT: 42 U/L (ref 0–44)
AST: 29 U/L (ref 15–41)
Albumin: 4.1 g/dL (ref 3.5–5.0)
Alkaline Phosphatase: 83 U/L (ref 38–126)
Anion gap: 11 (ref 5–15)
BUN: 24 mg/dL — AB (ref 6–20)
CALCIUM: 9.2 mg/dL (ref 8.9–10.3)
CO2: 21 mmol/L — ABNORMAL LOW (ref 22–32)
Chloride: 99 mmol/L (ref 98–111)
Creatinine, Ser: 1.32 mg/dL — ABNORMAL HIGH (ref 0.61–1.24)
GFR calc Af Amer: >60 mL/min (ref >60)
Glucose, Bld: 257 mg/dL — ABNORMAL HIGH (ref 70–99)
Potassium: 3.9 mmol/L (ref 3.5–5.1)
Sodium: 131 mmol/L — ABNORMAL LOW (ref 135–145)
Total Bilirubin: 0.6 mg/dL (ref 0.3–1.2)
Total Protein: 7.3 g/dL (ref 6.5–8.1)

## 2018-07-26 LAB — URINALYSIS, ROUTINE W REFLEX MICROSCOPIC
Bilirubin Urine: NEGATIVE
Glucose, UA: 100 mg/dL — AB
Hgb urine dipstick: NEGATIVE
KETONES UR: 15 mg/dL — AB
Leukocytes,Ua: NEGATIVE
Nitrite: NEGATIVE
PROTEIN: NEGATIVE mg/dL
Specific Gravity, Urine: 1.02 (ref 1.005–1.030)
pH: 6 (ref 5.0–8.0)

## 2018-07-26 LAB — LIPASE, BLOOD: Lipase: 26 U/L (ref 11–51)

## 2018-07-26 LAB — TROPONIN I: Troponin I: <0.03 ng/mL (ref <0.03)

## 2018-07-26 MED ORDER — KETOROLAC TROMETHAMINE 30 MG/ML IJ SOLN
30.0000 mg | Freq: Once | INTRAMUSCULAR | Status: AC
  Administered 2018-07-26: 30 mg via INTRAVENOUS
  Filled 2018-07-26: qty 1

## 2018-07-26 MED ORDER — ONDANSETRON HCL 4 MG/2ML IJ SOLN
4.0000 mg | Freq: Once | INTRAMUSCULAR | Status: AC
  Administered 2018-07-26: 4 mg via INTRAVENOUS
  Filled 2018-07-26: qty 2

## 2018-07-26 MED ORDER — HYDROMORPHONE HCL 1 MG/ML IJ SOLN
1.0000 mg | Freq: Once | INTRAMUSCULAR | Status: AC
  Administered 2018-07-26: 1 mg via INTRAVENOUS
  Filled 2018-07-26: qty 1

## 2018-07-26 MED ORDER — DICYCLOMINE HCL 20 MG PO TABS: 20.0000 mg | ORAL_TABLET | Freq: Three times a day (TID) | ORAL | 0 refills | Status: AC | PRN

## 2018-07-26 MED ORDER — SUCRALFATE 1 G PO TABS: 1.0000 g | ORAL_TABLET | Freq: Three times a day (TID) | ORAL | 0 refills | Status: AC

## 2018-07-26 NOTE — Discharge Instructions (Signed)

## 2018-07-26 NOTE — ED Provider Notes (Signed)
Blood pressure (!) 203/104, pulse 95, temperature 97.6 F (36.4 C), temperature source Oral, resp. rate 16, height 5\' 11"  (1.803 m), weight (!) 205.3 kg, SpO2 98 %.  Assuming care from Dr. Manus Gunning.  In short, John Osborn is a 53 y.o. male with a chief complaint of Abdominal Pain .  Refer to the original H&P for additional details.  The current plan of care is to f/u CT and reassess.  07:45 AM  CT imaging with no obstructing stone.  Patient does have stones in the kidneys but not obstruction doubt this is the etiology of his discomfort.  On my evaluation patient's pain is mostly left upper quadrant and seems most consistent with gastritis type symptoms.  He denies any ripping/tearing quality to his pain.  While he does have elevated blood pressures he did not take his blood pressure medication this morning.  My suspicion for aortic dissection is very low.  No evidence of AAA on Noncon CT. Troponin is negative. Patient's pain has resolved. Plan for UA and likely discharge with symptom mgmt at home.   08:47 AM  Patient remains well-appearing.  Symptoms well controlled.  Suspect a GI source of symptoms.  I have called in prescriptions for Bentyl and Carafate.  Patient has Zofran at home.  Also provided contact information for gastroenterology.  Discussed PCP follow-up plan and emergency department return precautions in detail.  Patient is pleased at discharge.    Maia Plan, MD 07/26/18 [phone]

## 2018-07-26 NOTE — ED Triage Notes (Signed)
Took percocet 5/325 midnight and then again at 330. Also took zofran at  home

## 2018-07-26 NOTE — ED Triage Notes (Signed)
Pt states yesterday late afternoon he started feeling bad. Started dry heaving. Complains of lower back pain on the left side. States history of kidney stones. Also states he has Immuneglobulin.

## 2018-07-26 NOTE — ED Provider Notes (Signed)
[facility] EMERGENCY DEPARTMENT Provider Note   CSN: [id number] Arrival date & time: 07/26/18  4098    History   Chief Complaint Chief Complaint  Patient presents with  . Abdominal Pain    HPI John Osborn is a 53 y.o. male.     Patient presents with left-sided flank pain, abdominal pain and back pain onset last evening.  It is been fairly constant and came on after he ate a hamburger.  He states he has a history of an intestinal parasite that causes him problems when he eats meat and is not sure if this is related.  Has had kidney stones in the past but feels this is a bit higher than he is used to.  He took Percocet at home x2 as well as Zofran without much relief.  Has had several episodes of dry heaving.  Denies pain with urination or blood in the urine.  Denies chest pain or shortness of breath.  No testicular pain.  No previous abdominal surgeries. The pain does not radiate down his buttock or down his legs. No bowel or bladder incontinence.  No fever. States he has had multiple kidney stones in the past that required basket retrieval.  The history is provided by the patient.    Past Medical History:  Diagnosis Date  . Gout   . Kidney stones   . Morbid obesity (HCC) 01/16/2017  . Obesity   . OSA on CPAP     Patient Active Problem List   Diagnosis Date Noted  . Morbid obesity (HCC) 01/16/2017  . OSA on CPAP   . Gout     Past Surgical History:  Procedure Laterality Date  . LITHOTRIPSY    . SHOULDER SURGERY          Home Medications    Prior to Admission medications   Medication Sig Start Date End Date Taking? Authorizing Provider  allopurinol (ZYLOPRIM) 300 MG tablet Take 300 mg by mouth daily.    [provider]  Cholecalciferol 10000 units TABS Take 4,000 Units by mouth daily.    [provider]  Cyanocobalamin (B-12 PO) Take 1 tablet by mouth daily.    [provider]  Emtricitabine-Tenofovir DF (TRUVADA PO) Take  by mouth daily.    [provider]  fluticasone (FLONASE) 50 MCG/ACT nasal spray Place 2 sprays into both nostrils as directed.     [provider]  indapamide (LOZOL) 2.5 MG tablet Take 2.5 mg by mouth daily.    [provider]  Misc Natural Products (STRESS RELEAF PO) Take by mouth daily.    [provider]  Multiple Vitamin (MULTIVITAMIN) tablet Take 1 tablet by mouth daily.    [provider]  Multiple Vitamins-Minerals (ZINC PO) Take 10 mg by mouth daily.    [provider]  Naltrexone-Bupropion HCl ER 8-90 MG TB12 Take 2 tablets by mouth 2 (two) times daily.    [provider]  sildenafil (VIAGRA) 100 MG tablet Take 50 mg by mouth daily as needed for erectile dysfunction.    [provider]  testosterone cypionate (DEPOTESTOSTERONE CYPIONATE) 200 MG/ML injection Inject into the muscle every 21 ( twenty-one) days.    [provider]    Family History Family History  Problem Relation Age of Onset  . Breast cancer Mother   . Aortic stenosis Father   . Diabetes Father     Social History Social History   Tobacco Use  . Smoking status: Never Smoker  .  Smokeless tobacco: Never Used  Substance Use Topics  . Alcohol use: Yes    Comment: socially  . Drug use: No     Allergies   No known allergies   Review of Systems Review of Systems  Constitutional: Negative for activity change, appetite change and fever.  HENT: Negative for congestion and rhinorrhea.   Eyes: Negative for visual disturbance.  Respiratory: Negative for chest tightness and shortness of breath.   Cardiovascular: Negative for chest pain.  Gastrointestinal: Positive for abdominal pain, nausea and vomiting.  Genitourinary: Positive for difficulty urinating and flank pain. Negative for dysuria, hematuria, testicular pain and urgency.  Musculoskeletal: Positive for back pain.  Skin: Negative for rash.  Neurological: Negative for  dizziness, weakness and headaches.   all other systems are negative except as noted in the HPI and PMH.     Physical Exam Updated Vital Signs BP (!) 203/104   Pulse 95   Temp 97.6 F (36.4 C) (Oral)   Resp (!) 24   Ht 5\' 11"  (1.803 m)   Wt (!) 204.1 kg   SpO2 98%   BMI 62.76 kg/m   Physical Exam Vitals signs and nursing note reviewed.  Constitutional:      General: He is not in acute distress.    Appearance: He is well-developed. He is obese.     Comments: uncomfortable  HENT:     Head: Normocephalic and atraumatic.     Mouth/Throat:     Pharynx: No oropharyngeal exudate.  Eyes:     Conjunctiva/sclera: Conjunctivae normal.     Pupils: Pupils are equal, round, and reactive to light.  Neck:     Musculoskeletal: Normal range of motion and neck supple.     Comments: No meningismus. Cardiovascular:     Rate and Rhythm: Normal rate and regular rhythm.     Heart sounds: Normal heart sounds. No murmur.     Comments: Intact DP and PT pulses bilaterally Pulmonary:     Effort: Pulmonary effort is normal. No respiratory distress.     Breath sounds: Normal breath sounds.  Abdominal:     Palpations: Abdomen is soft.     Tenderness: There is abdominal tenderness. There is no guarding or rebound.     Comments: Exam limited by body habitus.  There is left-sided abdominal tenderness in the upper and lower quadrants.  Musculoskeletal: Normal range of motion.        General: Tenderness present.     Comments: Left paraspinal lumbar tenderness  Skin:    General: Skin is warm.     Capillary Refill: Capillary refill takes less than 2 seconds.  Neurological:     General: No focal deficit present.     Mental Status: He is alert and oriented to person, place, and time. Mental status is at baseline.     Cranial Nerves: No cranial nerve deficit.     Motor: No abnormal muscle tone.     Coordination: Coordination normal.     Comments:  5/5 strength throughout. CN 2-12 intact.Equal grip  strength.   Psychiatric:        Behavior: Behavior normal.      ED Treatments / Results  Labs (all labs ordered are listed, but only abnormal results are displayed) Labs Reviewed  CBC WITH DIFFERENTIAL/PLATELET - Abnormal; Notable for the following components:      Result Value   WBC 11.5 (*)    Neutro Abs 9.5 (*)    All other components within normal limits  COMPREHENSIVE METABOLIC PANEL - Abnormal; Notable for the following components:   Sodium 131 (*)    CO2 21 (*)    Glucose, Bld 257 (*)    BUN 24 (*)    Creatinine, Ser 1.32 (*)    All other components within normal limits  LIPASE, BLOOD  TROPONIN I  URINALYSIS, ROUTINE W REFLEX MICROSCOPIC    EKG None  Radiology No results found.  Procedures Procedures (including critical care time)  Medications Ordered in ED Medications - No data to display   Initial Impression / Assessment and Plan / ED Course  I have reviewed the triage vital signs and the nursing notes.  Pertinent labs & imaging results that were available during my care of the patient were reviewed by me and considered in my medical decision making (see chart for details).       Patient with dry heaving of left flank and abdominal pain similar to previous kidney stones.  No fever.  Exam rather limited by his body habitus.  He states he has had stones in the past but this feels a bit higher.  We will obtain labs, urine, symptom control.  Anticipate CT scan if patient is able to be accommodated with his size.  Labs appear stable.  Kidney function is at baseline. Patient's pain is improved with medications.  Awaiting urinalysis and CT scan.  Care to be transferred to Dr. Jacqulyn Bath at shift change.  Final Clinical Impressions(s) / ED Diagnoses   Final diagnoses:  None    ED Discharge Orders    None       Anjali Manzella, Jeannett Senior, MD 07/26/18 [phone]

## 2018-07-28 ENCOUNTER — Other Ambulatory Visit: Payer: Self-pay | Admitting: Physician Assistant

## 2018-07-28 ENCOUNTER — Ambulatory Visit
Admission: RE | Admit: 2018-07-28 | Discharge: 2018-07-28 | Disposition: A | Discharge status: Home / Self Care | Payer: No Typology Code available for payment source | Source: Ambulatory Visit | Attending: Physician Assistant | Admitting: Physician Assistant

## 2018-07-28 DIAGNOSIS — R319 Hematuria, unspecified: Secondary | ICD-10-CM

## 2018-07-28 DIAGNOSIS — D72829 Elevated white blood cell count, unspecified: Secondary | ICD-10-CM

## 2018-07-28 DIAGNOSIS — R1031 Right lower quadrant pain: Secondary | ICD-10-CM

## 2018-07-28 MED ORDER — IOPAMIDOL (ISOVUE-300) INJECTION 61%
125.0000 mL | Freq: Once | INTRAVENOUS | Status: AC | PRN
  Administered 2018-07-28: 125 mL via INTRAVENOUS

## 2018-07-29 ENCOUNTER — Other Ambulatory Visit: Payer: Self-pay | Admitting: Physician Assistant

## 2018-07-29 DIAGNOSIS — R319 Hematuria, unspecified: Secondary | ICD-10-CM

## 2018-07-29 DIAGNOSIS — R1031 Right lower quadrant pain: Secondary | ICD-10-CM

## 2018-07-29 DIAGNOSIS — D72829 Elevated white blood cell count, unspecified: Secondary | ICD-10-CM

## 2018-08-01 MED ORDER — SODIUM CHLORIDE 0.9 % IV SOLN
10.00 | INTRAVENOUS | Status: DC
Start: ? — End: 2018-08-01

## 2018-08-01 MED ORDER — FEBUXOSTAT 40 MG PO TABS
40.00 | ORAL_TABLET | ORAL | Status: DC
Start: 2018-08-01 — End: 2018-08-01

## 2018-08-01 MED ORDER — LISINOPRIL 10 MG PO TABS
10.00 | ORAL_TABLET | ORAL | Status: DC
Start: 2018-08-01 — End: 2018-08-01

## 2018-08-01 MED ORDER — ZOLPIDEM TARTRATE 5 MG PO TABS
10.00 | ORAL_TABLET | ORAL | Status: DC
Start: ? — End: 2018-08-01

## 2018-08-01 MED ORDER — ACETAMINOPHEN 325 MG PO TABS
650.00 | ORAL_TABLET | ORAL | Status: DC
Start: 2018-08-01 — End: 2018-08-01

## 2018-08-01 MED ORDER — GENERIC EXTERNAL MEDICATION
10.00 | Status: DC
Start: ? — End: 2018-08-01

## 2018-08-01 MED ORDER — GENERIC EXTERNAL MEDICATION
3.00 | Status: DC
Start: ? — End: 2018-08-01

## 2018-08-01 MED ORDER — INDAPAMIDE 2.5 MG PO TABS
2.50 | ORAL_TABLET | ORAL | Status: DC
Start: 2018-08-01 — End: 2018-08-01

## 2018-08-01 MED ORDER — GENERIC EXTERNAL MEDICATION
4.00 | Status: DC
Start: ? — End: 2018-08-01

## 2018-08-01 MED ORDER — ENOXAPARIN SODIUM 40 MG/0.4ML ~~LOC~~ SOLN
40.00 | SUBCUTANEOUS | Status: DC
Start: 2018-08-01 — End: 2018-08-01

## 2018-08-01 MED ORDER — MORPHINE SULFATE (PF) 4 MG/ML IV SOLN
2.00 | INTRAVENOUS | Status: DC
Start: ? — End: 2018-08-01

## 2018-08-01 MED ORDER — MORPHINE SULFATE (PF) 4 MG/ML IV SOLN
4.00 | INTRAVENOUS | Status: DC
Start: ? — End: 2018-08-01

## 2018-08-01 MED ORDER — GENERIC EXTERNAL MEDICATION
3.38 | Status: DC
Start: 2018-08-01 — End: 2018-08-01

## 2018-08-01 MED ORDER — OXYCODONE HCL 5 MG PO TABS
5.00 | ORAL_TABLET | ORAL | Status: DC
Start: ? — End: 2018-08-01

## 2018-08-01 MED ORDER — DIPHENHYDRAMINE HCL 50 MG/ML IJ SOLN
25.00 | INTRAMUSCULAR | Status: DC
Start: ? — End: 2018-08-01

## 2024-05-27 ENCOUNTER — Telehealth: Payer: Self-pay

## 2024-05-27 NOTE — Telephone Encounter (Signed)
 John Osborn

## 2024-05-28 ENCOUNTER — Encounter: Payer: Self-pay | Admitting: Cardiology

## 2024-05-28 ENCOUNTER — Ambulatory Visit: Attending: Cardiology | Admitting: Cardiology

## 2024-05-28 VITALS — BP 130/70 | HR 64 | Ht 71.0 in | Wt 348.1 lb

## 2024-05-28 DIAGNOSIS — I4891 Unspecified atrial fibrillation: Secondary | ICD-10-CM

## 2024-05-28 DIAGNOSIS — G4733 Obstructive sleep apnea (adult) (pediatric): Secondary | ICD-10-CM | POA: Diagnosis not present

## 2024-05-28 MED ORDER — METOPROLOL TARTRATE 25 MG PO TABS: 25.0000 mg | ORAL_TABLET | Freq: Two times a day (BID) | ORAL | 3 refills | Status: AC | PRN

## 2024-05-28 NOTE — Progress Notes (Signed)
 "    Sleep Medicine CONSULT Note    Date:  05/28/2024   ID:  Debby Silvan, DOB Sep 25, 1965, MRN [id number]  PCP:  Samie Frederick, PA-C  Cardiologist: None   Chief Complaint  Patient presents with   New Patient (Initial Visit)    Obstructive sleep apnea    History of Present Illness:  Shiquan Mathieu is a 59 y.o. male who is being seen today for the evaluation of obstructive sleep apnea at the request of Joen Samie, [place].  This is a 59 year old male with a remote history of obstructive sleep apnea who has been on CPAP therapy, HTN, PAF in setting of acute GI illness.  He has morbid obesity and is s/p gastric bypass and lost 180lbs and gained 50lbs back.SABRA  He initially had a sleep study done in [place] in 1998 and was placed on CPAP at 15 cm H2O.  I saw him last in 2018 when he needed a new device.  He underwent repeat NPSG which showed severe obstructive sleep apnea with an AHI of 100.3/h and O2 saturations as low as 86% with moderate snoring.  A CPAP titration was recommended.  He was started on auto CPAP from 10 to 20 cm H2O.  He was lost to follow-up and never followed up after his pressure change.  He is now more than 5 years out from his last device and is here for follow-up.  He is doing well with his PAP device.  He  tolerates the Swift nasal mask and feels the pressure is adequate.  Since going on PAP he feels rested in the am and has no significant daytime sleepiness.  He denies any significant mouth or nasal dryness or nasal congestion.  He does not think that he snores. An Epworth Sleepiness Scale score was calculated the office today and this endorsed at 3 arguing against residual daytime sleepiness. Patient denies any episodes of bruxism, restless legs, hypnogonic hallucinations or cataplectic events.    He was in [place] for work and a while back and had a severe case of gastroenteritis with severe diarrhea and when he came home he had a warning on his apple watch that he was in afib and  confirmed by EKG.  He saw his Cardiologist in [place] with [facility] and was instructed to start Toprol  and Eliquis as he was still in afib once he was back from his [place] trip. SABRA  He also was on Lisinopril .     Past Medical History:  Diagnosis Date   Gout    Kidney stones    Morbid obesity (HCC) 01/16/2017   Obesity    OSA on CPAP     Past Surgical History:  Procedure Laterality Date   LITHOTRIPSY     SHOULDER SURGERY      Current Medications: Active Medications[1]  Allergies:   Alpha-d-galactosidase, Nsaids, and No known allergies   Social History   Socioeconomic History   Marital status: Media Planner    Spouse name: Not on file   Number of children: Not on file   Years of education: Not on file   Highest education level: Not on file  Occupational History   Not on file  Tobacco Use   Smoking status: Never   Smokeless tobacco: Never  Vaping Use   Vaping status: Never Used  Substance and Sexual Activity   Alcohol use: Yes    Comment: socially   Drug use: No   Sexual activity: Not on file  Other Topics  Concern   Not on file  Social History Narrative   Not on file   Social Drivers of Health   Tobacco Use: Low Risk (05/28/2024)   Patient History    Smoking Tobacco Use: Never    Smokeless Tobacco Use: Never    Passive Exposure: Not on file  Financial Resource Strain: Low Risk (06/23/2023)   Received from [facility]   Overall Financial Resource Strain (CARDIA)    Difficulty of Paying Living Expenses: Not hard at all  Food Insecurity: No Food Insecurity (06/23/2023)   Received from [facility]   Epic    Within the past 12 months, you worried that your food would run out before you got the money to buy more.: Never true    Within the past 12 months, the food you bought just didn't last and you didn't have money to get more.: Never true  Transportation Needs: No Transportation Needs (06/23/2023)   Received from [facility] - Transportation     Lack of Transportation (Medical): No    Lack of Transportation (Non-Medical): No  Physical Activity: Insufficiently Active (06/23/2023)   Received from [facility]   Exercise Vital Sign    On average, how many days per week do you engage in moderate to strenuous exercise (like a brisk walk)?: 3 days    On average, how many minutes do you engage in exercise at this level?: 40 min  Stress: Stress Concern Present (06/23/2023)   Received from [facility] - Occupational Stress Questionnaire    Feeling of Stress : Very much  Social Connections: Socially Isolated (06/23/2023)   Received from [facility]   Social Network    How would you rate your social network (family, work, friends)?: Little participation, lonely and socially isolated  Depression (PHQ2-9): Not on file  Alcohol Screen: Not on file  Housing: Low Risk (06/23/2023)   Received from [facility]    In the last 12 months, was there a time when you were not able to pay the mortgage or rent on time?: No    In the past 12 months, how many times have you moved where you were living?: 0    At any time in the past 12 months, were you homeless or living in a shelter (including now)?: No  Utilities: Not At Risk (06/23/2023)   Received from [facility] Utilities    Threatened with loss of utilities: No  Health Literacy: Not on file     Family History:  The patient's family history includes Aortic stenosis in his father; Breast cancer in his mother; Diabetes in his father.   ROS:   Please see the history of present illness.    ROS All other systems reviewed and are negative.     05/28/2024    9:21 AM  PAD Screen  Previous PAD dx? No   Previous surgical procedure? No   Pain with walking? No   Feet/toe relief with dangling? Yes   Painful, non-healing ulcers? No   Extremities discolored? No      Manually entered by patient       PHYSICAL EXAM:   VS:  BP (!) 149/88    Pulse 64   Ht 5' 11 (1.803 m)   Wt (!) 348 lb 1.6 oz (157.9 kg)   SpO2 96%   BMI 48.55 kg/m    GEN: Well nourished, well developed, in no  acute distress  HEENT: normal  Neck: no JVD, carotid bruits, or masses Cardiac: RRR; no murmurs, rubs, or gallops,no edema.  Intact distal pulses bilaterally.  Respiratory:  clear to auscultation bilaterally, normal work of breathing GI: soft, nontender, nondistended, + BS MS: no deformity or atrophy  Skin: warm and dry, no rash Neuro:  Alert and Oriented x 3, Strength and sensation are intact Psych: euthymic mood, full affect  Wt Readings from Last 3 Encounters:  05/28/24 (!) 348 lb 1.6 oz (157.9 kg)  07/26/18 (!) 452 lb 11.2 oz (205.3 kg)  03/10/17 (!) 430 lb (195 kg)      Studies/Labs Reviewed:   PAP compliance download, NPSG from 2018   EKG Interpretation Date/Time:  Friday May 28 2024 12:12:44 EST Ventricular Rate:  69 PR Interval:  180 QRS Duration:  98 QT Interval:  388 QTC Calculation: 415 R Axis:   34  Text Interpretation: Normal sinus rhythm Normal ECG When compared with ECG of 26-Jul-2018 06:51, PREVIOUS ECG IS PRESENT Confirmed by Shlomo Corning ([id number]) on 05/28/2024 12:17:05 PM   Recent Labs: No results found for requested labs within last 365 days.     ASSESSMENT:    1. OSA on CPAP   2. Atrial fibrillation, unspecified type (HCC)      PLAN:  In order of problems listed above:  OSA - The patient is tolerating PAP therapy well without any problems. The PAP download performed by his DME was personally reviewed and interpreted by me today and showed an AHI of 0.2 /hr on auto CPAP 10-20 cm H2O with 100% compliance in using more than 4 hours nightly.  The patient has been using and benefiting from PAP use and will continue to benefit from therapy.  -his device has a warning of end of life on power and it is 59 years old -I will order a new ResMed Airsense 11 auto CPAP from 10-20cm H2O with heated humidity and  supplies - He will see me back 6 weeks after he gets his new device to document compliance per insurance requirements  PAF - This occurred in the setting of a severe diarrheal illness when he was in [place] - He was seen by his cardiologist in [place] when he got back and was still in A-fib and they placed him on metoprolol .  He was also having some problems with anal fissures and bleeding so they did not put him on anticoagulation at that time - He is now back in sinus rhythm with a CHADS2 VASC score of 0 - Not a candidate at this time for anticoagulation due to low CHA2DS2-VASc score - I encouraged him to stop aspirin as that is no data for reduced risk for cardioembolic events on aspirin alone - Recommend taking short acting metoprolol  to tartrate 25 mg as needed for breakthrough palpitations - He will let me know if he has any further palpitations or A-fib on his Apple Watch - Check 2D echo to assess LV function and left atrial size   Time Spent: 20 minutes total time of encounter, including 15 minutes spent in face-to-face patient care on the date of this encounter. This time includes coordination of care and counseling regarding above mentioned problem list. Remainder of non-face-to-face time involved reviewing chart documents/testing relevant to the patient encounter and documentation in the medical record. I have independently reviewed documentation from referring provider  Medication Adjustments/Labs and Tests Ordered: Current medicines are reviewed at length with the patient today.  Concerns regarding medicines  are outlined above.  Medication changes, Labs and Tests ordered today are listed in the Patient Instructions below.  There are no Patient Instructions on file for this visit.  Follow-up in 1 year  Signed, Wilbert Bihari, MD  05/28/2024 12:19 PM    [facility] [address] [place], [place], [place]  [address] Phone: [phone]; Fax: [phone]       [1]  Current Meds  Medication Sig   diazepam (VALIUM) 10 MG tablet Take 10 mg by mouth as needed for anxiety.   emtricitabine-tenofovir (TRUVADA) 200-300 MG tablet Take 1 tablet by mouth daily.   febuxostat  (ULORIC ) 40 MG tablet Take 40 mg by mouth daily.   lisinopril  (ZESTRIL ) 10 MG tablet Take 10 mg by mouth daily.   pantoprazole (PROTONIX) 40 MG tablet Take 40 mg by mouth daily.   tirzepatide (ZEPBOUND) 7.5 MG/0.5ML injection vial Inject 0.5 mLs into the skin once a week.   "

## 2024-05-28 NOTE — Patient Instructions (Signed)
 Medication Instructions:  Please START taking metoprolol  tartrate 25 mg every 12 hours PRN for palpitations.  *If you need a refill on your cardiac medications before your next appointment, please call your pharmacy*  Lab Work: None.  If you have labs (blood work) drawn today and your tests are completely normal, you will receive your results only by: MyChart Message (if you have MyChart) OR A paper copy in the mail If you have any lab test that is abnormal or we need to change your treatment, we will call you to review the results.  Testing/Procedures: Your physician has requested that you have an echocardiogram. Echocardiography is a painless test that uses sound waves to create images of your heart. It provides your doctor with information about the size and shape of your heart and how well your hearts chambers and valves are working. This procedure takes approximately one hour. There are no restrictions for this procedure. Please do NOT wear cologne, perfume, aftershave, or lotions (deodorant is allowed). Please arrive 15 minutes prior to your appointment time.  Please note: We ask at that you not bring children with you during ultrasound (echo/ vascular) testing. Due to room size and safety concerns, children are not allowed in the ultrasound rooms during exams. Our front office staff cannot provide observation of children in our lobby area while testing is being conducted. An adult accompanying a patient to their appointment will only be allowed in the ultrasound room at the discretion of the ultrasound technician under special circumstances. We apologize for any inconvenience.   Follow-Up: At [facility], you and your health needs are our priority.  As part of our continuing mission to provide you with exceptional heart care, our providers are all part of one team.  This team includes your primary Cardiologist (physician) and Advanced Practice Providers or APPs (Physician  Assistants and Nurse Practitioners) who all work together to provide you with the care you need, when you need it.  Your next appointment will be dependent on the delivery of your cpap equipment and it will be with:     Provider:   Dr. Wilbert Bihari, MD

## 2024-05-28 NOTE — Addendum Note (Signed)
 Addended by: JANIT GENI CROME on: 05/28/2024 12:36 PM   Modules accepted: Orders

## 2024-06-04 ENCOUNTER — Telehealth: Payer: Self-pay | Admitting: *Deleted

## 2024-06-04 DIAGNOSIS — G4733 Obstructive sleep apnea (adult) (pediatric): Secondary | ICD-10-CM

## 2024-06-04 DIAGNOSIS — I4891 Unspecified atrial fibrillation: Secondary | ICD-10-CM

## 2024-06-04 NOTE — Telephone Encounter (Signed)
 Order a new ResMed Airsense 11 auto CPAP from 10 to 20cm H2O with heated humidity and mask of choice with supplies    Upon patient request DME selection is [facility]. Patient understands he will be contacted by [facility] to set up his cpap.[facility]. Patient understands to call if [facility] does not contact him with new setup in a timely manner. Patient understands they will be called once confirmation has been received from [facility] that they have received their new machine to schedule 10 week follow up appointment.  [facility] notified of new cpap order  Please add to airview Patient was grateful for the call and thanked me.

## 2024-06-04 NOTE — Telephone Encounter (Signed)
-----   Message from Wilbert Bihari, MD sent at 05/28/2024 11:55 AM EST ----- Order a new ResMed Airsense 11 auto CPAP from 10 to 20cm H2O with heated humidity and mask of choice with supplies

## 2024-06-15 ENCOUNTER — Ambulatory Visit (HOSPITAL_BASED_OUTPATIENT_CLINIC_OR_DEPARTMENT_OTHER)
Admission: RE | Admit: 2024-06-15 | Discharge: 2024-06-15 | Disposition: A | Discharge status: Home / Self Care | Source: Ambulatory Visit | Attending: Cardiology | Admitting: Cardiology

## 2024-06-15 ENCOUNTER — Ambulatory Visit: Payer: Self-pay | Admitting: Cardiology

## 2024-06-15 DIAGNOSIS — I4891 Unspecified atrial fibrillation: Secondary | ICD-10-CM | POA: Insufficient documentation

## 2024-06-15 LAB — ECHOCARDIOGRAM COMPLETE
AR max vel: 2.8 cm2
AV Area VTI: 2.68 cm2
AV Area mean vel: 2.66 cm2
AV Mean grad: 3 mmHg
AV Peak grad: 5.3 mmHg
Ao pk vel: 1.16 m/s
Area-P 1/2: 3.5 cm2
Calc EF: 64 %
S' Lateral: 3 cm
Single Plane A2C EF: 60 %
Single Plane A4C EF: 62.6 %

## 2024-06-17 ENCOUNTER — Encounter: Payer: Self-pay | Admitting: Cardiology

## 2024-06-17 NOTE — Telephone Encounter (Signed)
 Call to patient to echo results, no answer. Left VM asking recipient to call [place] at our office #. [facility] also sent.

## 2024-06-17 NOTE — Telephone Encounter (Signed)
-----   Message from Wilbert Bihari, MD sent at 06/15/2024  6:40 PM EST ----- 2D echo normal except could not get good views of the aortic valve.  There is a family hx of bicuspid AV and cannot rule this out on current echo.  This was also not well visualized on 2018 echo.   Echo at [facility] in 2022 documented a trileaflet AV but poorly visualized.  I would like him to have a cardiac CTA to assess AV further and also get coronaries at the same time.He will need a BMET  prior to study

## 2024-06-22 ENCOUNTER — Other Ambulatory Visit (HOSPITAL_COMMUNITY): Payer: Self-pay

## 2024-06-22 ENCOUNTER — Other Ambulatory Visit: Payer: Self-pay

## 2024-06-22 DIAGNOSIS — Z01812 Encounter for preprocedural laboratory examination: Secondary | ICD-10-CM

## 2024-06-22 DIAGNOSIS — I2510 Atherosclerotic heart disease of native coronary artery without angina pectoris: Secondary | ICD-10-CM

## 2024-06-22 DIAGNOSIS — I359 Nonrheumatic aortic valve disorder, unspecified: Secondary | ICD-10-CM

## 2024-06-22 MED ORDER — METOPROLOL TARTRATE 100 MG PO TABS
100.0000 mg | ORAL_TABLET | Freq: Once | ORAL | 0 refills | Status: AC
  Filled 2024-06-22: qty 1, 1d supply, fill #0

## 2024-07-07 ENCOUNTER — Ambulatory Visit (HOSPITAL_COMMUNITY)
